# Patient Record
Sex: Female | Born: 1970 | Race: White | Hispanic: No | Marital: Married | Smoking: Never smoker
Health system: Southern US, Community
[De-identification: ages and names within clinical notes are randomized; demographics above are authoritative.]

## PROBLEM LIST (undated history)

## (undated) DIAGNOSIS — I10 Essential (primary) hypertension: Secondary | ICD-10-CM

## (undated) DIAGNOSIS — N189 Chronic kidney disease, unspecified: Secondary | ICD-10-CM

## (undated) DIAGNOSIS — R51 Headache: Secondary | ICD-10-CM

## (undated) DIAGNOSIS — L719 Rosacea, unspecified: Secondary | ICD-10-CM

## (undated) DIAGNOSIS — I7300 Raynaud's syndrome without gangrene: Secondary | ICD-10-CM

## (undated) DIAGNOSIS — C801 Malignant (primary) neoplasm, unspecified: Secondary | ICD-10-CM

## (undated) DIAGNOSIS — G43009 Migraine without aura, not intractable, without status migrainosus: Principal | ICD-10-CM

## (undated) HISTORY — DX: Essential (primary) hypertension: I10

## (undated) HISTORY — DX: Chronic kidney disease, unspecified: N18.9

## (undated) HISTORY — DX: Malignant (primary) neoplasm, unspecified: C80.1

## (undated) HISTORY — DX: Migraine without aura, not intractable, without status migrainosus: G43.009

## (undated) HISTORY — DX: Rosacea, unspecified: L71.9

## (undated) HISTORY — PX: SKIN SURGERY: SHX2413

## (undated) HISTORY — DX: Raynaud's syndrome without gangrene: I73.00

## (undated) HISTORY — DX: Headache: R51

---

## 1998-09-08 ENCOUNTER — Other Ambulatory Visit: Admission: RE | Admit: 1998-09-08 | Discharge: 1998-09-08 | Payer: Self-pay | Admitting: Obstetrics and Gynecology

## 1999-03-17 ENCOUNTER — Other Ambulatory Visit: Admission: RE | Admit: 1999-03-17 | Discharge: 1999-03-17 | Payer: Self-pay | Admitting: Obstetrics and Gynecology

## 1999-09-07 ENCOUNTER — Other Ambulatory Visit: Admission: RE | Admit: 1999-09-07 | Discharge: 1999-09-07 | Payer: Self-pay | Admitting: Internal Medicine

## 1999-10-25 ENCOUNTER — Other Ambulatory Visit: Admission: RE | Admit: 1999-10-25 | Discharge: 1999-10-25 | Payer: Self-pay | Admitting: *Deleted

## 2000-03-21 ENCOUNTER — Other Ambulatory Visit: Admission: RE | Admit: 2000-03-21 | Discharge: 2000-03-21 | Payer: Self-pay | Admitting: Obstetrics and Gynecology

## 2000-03-22 ENCOUNTER — Other Ambulatory Visit: Admission: RE | Admit: 2000-03-22 | Discharge: 2000-03-22 | Payer: Self-pay | Admitting: *Deleted

## 2000-03-22 ENCOUNTER — Encounter (INDEPENDENT_AMBULATORY_CARE_PROVIDER_SITE_OTHER): Payer: Self-pay

## 2000-04-26 ENCOUNTER — Ambulatory Visit (HOSPITAL_COMMUNITY): Admission: RE | Admit: 2000-04-26 | Discharge: 2000-04-26 | Payer: Self-pay | Admitting: Obstetrics and Gynecology

## 2000-04-28 ENCOUNTER — Encounter: Payer: Self-pay | Admitting: Obstetrics and Gynecology

## 2000-04-28 ENCOUNTER — Inpatient Hospital Stay (HOSPITAL_COMMUNITY): Admission: AD | Admit: 2000-04-28 | Discharge: 2000-04-28 | Payer: Self-pay | Admitting: Obstetrics and Gynecology

## 2000-05-02 ENCOUNTER — Emergency Department (HOSPITAL_COMMUNITY): Admission: EM | Admit: 2000-05-02 | Discharge: 2000-05-02 | Payer: Self-pay | Admitting: Emergency Medicine

## 2000-05-02 ENCOUNTER — Encounter: Payer: Self-pay | Admitting: Emergency Medicine

## 2000-08-02 ENCOUNTER — Other Ambulatory Visit: Admission: RE | Admit: 2000-08-02 | Discharge: 2000-08-02 | Payer: Self-pay | Admitting: Obstetrics and Gynecology

## 2001-01-30 ENCOUNTER — Other Ambulatory Visit: Admission: RE | Admit: 2001-01-30 | Discharge: 2001-01-30 | Payer: Self-pay | Admitting: Obstetrics and Gynecology

## 2001-05-15 ENCOUNTER — Inpatient Hospital Stay (HOSPITAL_COMMUNITY): Admission: AD | Admit: 2001-05-15 | Discharge: 2001-05-15 | Payer: Self-pay | Admitting: Obstetrics and Gynecology

## 2001-07-26 ENCOUNTER — Inpatient Hospital Stay (HOSPITAL_COMMUNITY): Admission: AD | Admit: 2001-07-26 | Discharge: 2001-07-29 | Payer: Self-pay | Admitting: Obstetrics and Gynecology

## 2001-08-02 ENCOUNTER — Encounter: Admission: RE | Admit: 2001-08-02 | Discharge: 2001-09-01 | Payer: Self-pay | Admitting: Obstetrics and Gynecology

## 2001-08-27 ENCOUNTER — Other Ambulatory Visit: Admission: RE | Admit: 2001-08-27 | Discharge: 2001-08-27 | Payer: Self-pay | Admitting: Obstetrics and Gynecology

## 2002-08-28 ENCOUNTER — Other Ambulatory Visit: Admission: RE | Admit: 2002-08-28 | Discharge: 2002-08-28 | Payer: Self-pay | Admitting: Obstetrics and Gynecology

## 2003-07-14 ENCOUNTER — Inpatient Hospital Stay (HOSPITAL_COMMUNITY): Admission: AD | Admit: 2003-07-14 | Discharge: 2003-07-17 | Payer: Self-pay | Admitting: Obstetrics and Gynecology

## 2003-08-24 ENCOUNTER — Other Ambulatory Visit: Admission: RE | Admit: 2003-08-24 | Discharge: 2003-08-24 | Payer: Self-pay | Admitting: Obstetrics and Gynecology

## 2005-11-21 ENCOUNTER — Encounter: Admission: RE | Admit: 2005-11-21 | Discharge: 2005-11-21 | Payer: Self-pay | Admitting: *Deleted

## 2006-02-28 ENCOUNTER — Ambulatory Visit (HOSPITAL_COMMUNITY): Admission: RE | Admit: 2006-02-28 | Discharge: 2006-02-28 | Payer: Self-pay | Admitting: Obstetrics and Gynecology

## 2008-12-03 ENCOUNTER — Encounter: Admission: RE | Admit: 2008-12-03 | Discharge: 2008-12-03 | Payer: Self-pay | Admitting: Obstetrics and Gynecology

## 2008-12-09 ENCOUNTER — Encounter: Admission: RE | Admit: 2008-12-09 | Discharge: 2008-12-09 | Payer: Self-pay | Admitting: Obstetrics and Gynecology

## 2009-06-10 ENCOUNTER — Encounter: Admission: RE | Admit: 2009-06-10 | Discharge: 2009-06-10 | Payer: Self-pay | Admitting: Obstetrics and Gynecology

## 2009-12-06 ENCOUNTER — Encounter: Admission: RE | Admit: 2009-12-06 | Discharge: 2009-12-06 | Payer: Self-pay | Admitting: Obstetrics and Gynecology

## 2009-12-09 ENCOUNTER — Encounter: Admission: RE | Admit: 2009-12-09 | Discharge: 2009-12-09 | Payer: Self-pay | Admitting: Obstetrics and Gynecology

## 2010-11-26 ENCOUNTER — Other Ambulatory Visit: Payer: Self-pay | Admitting: Obstetrics and Gynecology

## 2010-11-26 DIAGNOSIS — Z0000 Encounter for general adult medical examination without abnormal findings: Secondary | ICD-10-CM

## 2010-12-07 ENCOUNTER — Ambulatory Visit
Admission: RE | Admit: 2010-12-07 | Discharge: 2010-12-07 | Disposition: A | Discharge status: Home / Self Care | Payer: BC Managed Care – PPO | Source: Ambulatory Visit | Attending: Obstetrics and Gynecology | Admitting: Obstetrics and Gynecology

## 2010-12-07 DIAGNOSIS — Z0000 Encounter for general adult medical examination without abnormal findings: Secondary | ICD-10-CM

## 2011-03-24 NOTE — H&P (Signed)
[facility]  PatientRessie Richard, [place]                           MRN: [id number] Attending:  Lenoard Aden, M.D. CC:         [facility]                         History and Physical  CHIEF COMPLAINT:                  Worsening pelvic pain.  HISTORY OF PRESENT ILLNESS:       The patient is a 40 year old white female, G0, P0, who presents with chronic right-sided pain.  She has had no previous history of endometriosis.  Pregnancy history is noncontributory.  GYNECOLOGIC HISTORY:              Noncontributory.  PAST MEDICAL HISTORY:             Remarkable for abnormal Pap smear.  Wisdom teeth removal.  Chronic hypertension.  Raynauds syndrome.  MEDICATIONS:                      Cardizem, Ovral, and a multivitamin.  ALLERGIES:                        NEOSPORIN, FLAGYL, and SULFA.  SOCIAL HISTORY:                   The patient is a nonsmoker and nondrinker, and she denies domestic or physical violence.  FAMILY HISTORY:                   Remarkable for diabetes and cardiovascular disease.  PHYSICAL EXAMINATION:  GENERAL:                          She is a well-developed, well-nourished white female in no apparent distress.  HEENT:                            Normal.  LUNGS:                            Clear.  HEART:                            Regular rhythm.  ABDOMEN:                          Soft, scaphoid, nontender.  LABORATORY DATA:                  Ultrasound revealing a normal size uterus with no adnexal masses.  IMPRESSION:                       Worsening right lower quadrant pain with increasing dysmenorrhea and dyspareunia.  PLAN:                             Diagnostic laparoscopy.  Risks of anesthesia, infection, bleeding, injury to abdominal organs, need for repair discussed.  Inability to cure pain noted.  The possibility of delayed versus immediate complications to  include bowel and bladder injury noted.  The patient  acknowledges and desires to proceed.DD:  04/26/00 TD:  04/26/00 Job: [id number]

## 2011-03-24 NOTE — Op Note (Signed)
NAMETondra Richard, [name]                  ACCOUNT NO.:  [id number]   MEDICAL RECORD NO.:  [id number]          PATIENT TYPE:  AMB   LOCATION:  SDC                           FACILITY:  [facility]   PHYSICIAN:  Lenoard Aden, M.D.DATE OF BIRTH:  [birth year]   DATE OF PROCEDURE:  02/28/2006  DATE OF DISCHARGE:                                 OPERATIVE REPORT   PREOPERATIVE DIAGNOSIS:  Right lower quadrant pain.   POSTOPERATIVE DIAGNOSIS:  Pelvic adhesions.   OPERATION PERFORMED:  Diagnostic laparoscopy, lysis of adhesions.   SURGEON:  Lenoard Aden, M.D.   ANESTHESIA:  General.   ESTIMATED BLOOD LOSS:  Less than 50 mL.   COMPLICATIONS:  None.   DRAINS:  Foley.   COUNTS:  Correct.   Patient to recovery in good condition.   DESCRIPTION OF PROCEDURE:  After being apprised of the risks of anesthesia,  infection, bleeding, intra-abdominal injuries with need for repair, delayed  versus immediate complications to include bowel or bladder injury, patient  brought to the operating room where she was administered general anesthetic  without complications, prepped and draped in the usual sterile fashion.  Foley catheter placed.  After achieving adequate anesthesia, single toothed  tenaculum and Rubens cannula were placed per vagina.  Infraumbilical  incision made after placement of a local anesthetic.  Veress needle placed.  Opening pressure -2.  4 L CO2 insufflated without difficulty.  Trocar  placed.  Visualization revealed atraumatic trocar entry, normal liver,  gallbladder bed. There are anterolateral adhesions of the right bowel  mesentery to the right pelvic and abdominal side wall and normal uterus,  normal cul-de-sac anteriorly and posteriorly, normal tubes and ovaries, no  evidence of endometriosis.  A 5 mm port was made for visualization.  Normal  appendix was visualized.  The adhesions are then grasped with an atraumatic  grasper and tended away from the anterior abdominal wall with  the bowel well  out of the operative field. These adhesions were then lysed using monopolar  scissors in an avascular fashion all the way to the level of their insertion  into the side wall and good fraying of the adhesions is noted.  Minimal  bleeding is noted.  Good hemostasis was achieved.  At this point  revealing no further pathology at this time, CO2 was released, all  instruments were removed under direct visualization.  Incisions were then  closed with 0 Vicryl and Dermabond.  Instruments removed from the vagina.  Patient was awakened, and transferred to recovery in good condition.      Lenoard Aden, M.D.  Electronically Signed     RJT/MEDQ  D:  02/28/2006  T:  03/01/2006  Job:  [id number]

## 2011-03-24 NOTE — H&P (Signed)
[facility] [place]  PatientAdana, Marik [facility] H Visit Number: [id number] MRN: [id number]          Service Type: OBS Location: 910B [id number] [id number] Attending Physician:  Lenoard Aden Dictated by:   Lenoard Aden, M.D. Admit Date:  07/26/2001   CC:         [facility]   History and Physical  CHIEF COMPLAINT:              Chronic hypertension at 38 weeks, for induction.  HISTORY OF PRESENT ILLNESS:   The patient is a 40 year old white female, G1, P0, EDD August 08, 2001, at 38+ weeks, for induction.  The patient has a history of laparoscopy with lysis of adhesions in June 2001, history of chronic hypertension.  MEDICATIONS:                  Aldomet.  ALLERGIES:                    NEO-SYNEPHRINE, FLAGYL, and IBUPROFEN.  PRENATAL COURSE:              Uncomplicated.  Blood pressure well controlled on Aldomet.  Fetal heart rate surveillance within normal limits.  PHYSICAL EXAMINATION:  GENERAL:                      Well-developed, well-nourished white female in no apparent distress.  HEENT:                        Normal.  LUNGS:                        Clear.  HEART:                        Regular rhythm.  ABDOMEN:                      Soft, gravid, nontender.  Estimated fetal weight 7 to 7-1/2 pounds.  PELVIC:                       Cervix is 2 cm, thick, vertex, -2.  EXTREMITIES:                  No cords.  NEUROLOGIC:                   Nonfocal.  IMPRESSION:                   Chronic hypertension, at term.  PLAN:                         Proceed with induction.  Anticipate attempts at vaginal delivery. Dictated by:   Lenoard Aden, M.D. Attending Physician:  Lenoard Aden DD:  07/26/01 TD:  07/26/01 Job: [id number]

## 2011-03-24 NOTE — Op Note (Signed)
[facility]  Patient:    Mary Richard, Mary Richard                         MRN: [id number] Proc. Date: 04/26/00 Adm. Date:  [id number] Disc. Date: [id number] Attending:  Lenoard Aden CC:         [facility]                           Operative Report  PREOPERATIVE DIAGNOSIS:       Chronic pelvic pain.  POSTOPERATIVE DIAGNOSIS:      Pelvic adhesions.  PROCEDURE:                    Diagnostic laparoscopy with lysis of adhesions.  SURGEON:                      Lenoard Aden, M.D.  ANESTHESIA:                   General.  ESTIMATED BLOOD LOSS:         Less than 50 cc.  COMPLICATIONS:                None.  DRAINS:                       None.  COUNTS:                       Correct.  DISPOSITION:                  To the recovery room in good condition.  BRIEF OPERATIVE NOTE:         After being apprised of the risks of anesthesia, infection, bleeding, injury to abdominal organs with the need for repair and delayed versus immediate complications to include bowel or bladder injury, possible inability to cure pain, the patient was brought to the operating room, where she was prepped and draped in the usual sterile fashion.  The bladder was catheterized.  The feet were in the [place] stirrups.  Examination under anesthesia revealed a small anteflexed uterus and no adnexal masses.  A Hulka tenaculum was placed per vagina.  An infraumbilical incision was made with a scalpel.  A Veress needle was placed.  Opening pressure of -2 was noted.  Then, 3 L of CO2 was insufflated without difficulty.  A 10 mm trocar was placed.  Visualization revealed atraumatic trocar entry, normal liver and gallbladder bed, adhesions of omentum to the right anterolateral pelvic sidewall and some adhesion of the sigmoid mesentery to the left pelvic sidewall.  A 5 mm trocar site was made atraumatically subprapubically.  A grasper was entered to help dissect all of these adhesions  using sharp monopolar cautery.  At no time was the cautery used close to the bowel and no interruption of bowel integrity of serosa was noted.  Normal uterus, normal tubes and ovaries were identified.  Otherwise, normal anterior and posterior cul-de-sac.  After lysis of adhesions was achieved and complete hemostasis was achieved, all instruments were removed under direct visualization.  CO2 was released. The incisions were closed using 0 and 4-0 Vicryl.  Dilute Marcaine solution was placed.  The instruments were removed from the vagina.  The patient tolerated the procedure well and went to the recovery room in good condition. DD:  04/26/00  TD:  04/29/00 Job: [id number]

## 2011-03-24 NOTE — H&P (Signed)
   NAME:  Mary Richard, Mary Richard                            ACCOUNT NO.:  [id number]   MEDICAL RECORD NO.:  [id number]                   PATIENT TYPE:  INP   LOCATION:  9174                                 FACILITY:  [facility]   PHYSICIAN:  Lenoard Aden, M.D.             DATE OF BIRTH:  04/23/1971   DATE OF ADMISSION:  07/14/2003  DATE OF DISCHARGE:                                HISTORY & PHYSICAL   CHIEF COMPLAINT:  Chronic hypertension, for induction.   HISTORY OF PRESENT ILLNESS:  The patient is a 40 year old white female, G 2,  P 1, with an EDD of July 25, 2003, at 38+ weeks, who presents for  induction, with chronic hypertension, at term.   ALLERGIES:  FLAGYL AND IBUPROFEN AND NEOSPORIN.   CURRENT MEDICATIONS:  1. Prenatal vitamins.  2. Aldomet.   PREVIOUS OBSTETRIC HISTORY:  Uncomplicated vaginal delivery of a 6 pound 13  ounce female in 2002.   PAST SURGICAL HISTORY:  The patient has a history of a laparoscopy for  adhesions in 2001.   PAST MEDICAL HISTORY:  History of hypertension, and currently on Aldomet as  noted.   FAMILY HISTORY:  Myocardial infarction, heart disease, migraines.   PRENATAL LABORATORY DATA:  Blood type O-positive.  Rh antibody negative.  Rubella immune.  VDRL nonreactive.  Hepatitis and HIV negative.  GBS  positive.   PRENATAL COURSE:  Otherwise uncomplicated.   PHYSICAL EXAMINATION:  GENERAL:  She is a well-nourished, well-developed  white female, in no acute distress.  HEENT:  Normal.  LUNGS:  Clear.  HEART:  A regular rhythm.  ABDOMEN:  Soft, gravid, nontender.  Estimated fetal weight is about 7-1/2  pounds.  PELVIC:  Cervix is 2 to 3 cm, 80%, vertex, and -2.  EXTREMITIES:  No cords.  NEUROLOGIC:  Nonfocal.    IMPRESSION:  Chronic hypertension, at term, for induction.   PLAN:  To proceed with Pitocin induction.  This has been discussed.                                                   Lenoard Aden, M.D.    RJT/MEDQ  D:   07/14/2003  T:  07/14/2003  Job:  [id number]

## 2011-11-09 ENCOUNTER — Other Ambulatory Visit: Payer: Self-pay | Admitting: Obstetrics and Gynecology

## 2011-11-09 DIAGNOSIS — Z1231 Encounter for screening mammogram for malignant neoplasm of breast: Secondary | ICD-10-CM

## 2011-12-12 ENCOUNTER — Ambulatory Visit
Admission: RE | Admit: 2011-12-12 | Discharge: 2011-12-12 | Disposition: A | Discharge status: Home / Self Care | Payer: BC Managed Care – PPO | Source: Ambulatory Visit | Attending: Obstetrics and Gynecology | Admitting: Obstetrics and Gynecology

## 2011-12-12 DIAGNOSIS — Z1231 Encounter for screening mammogram for malignant neoplasm of breast: Secondary | ICD-10-CM

## 2012-11-28 ENCOUNTER — Other Ambulatory Visit: Payer: Self-pay | Admitting: Obstetrics and Gynecology

## 2012-11-28 DIAGNOSIS — Z1231 Encounter for screening mammogram for malignant neoplasm of breast: Secondary | ICD-10-CM

## 2012-12-17 ENCOUNTER — Ambulatory Visit: Payer: Self-pay

## 2012-12-27 ENCOUNTER — Ambulatory Visit
Admission: RE | Admit: 2012-12-27 | Discharge: 2012-12-27 | Disposition: A | Discharge status: Home / Self Care | Payer: BC Managed Care – PPO | Source: Ambulatory Visit | Attending: Obstetrics and Gynecology | Admitting: Obstetrics and Gynecology

## 2012-12-27 DIAGNOSIS — Z1231 Encounter for screening mammogram for malignant neoplasm of breast: Secondary | ICD-10-CM

## 2013-01-06 ENCOUNTER — Ambulatory Visit: Payer: BC Managed Care – PPO | Attending: Internal Medicine

## 2013-01-06 DIAGNOSIS — IMO0001 Reserved for inherently not codable concepts without codable children: Secondary | ICD-10-CM | POA: Insufficient documentation

## 2013-01-06 DIAGNOSIS — M545 Low back pain, unspecified: Secondary | ICD-10-CM | POA: Insufficient documentation

## 2013-01-06 DIAGNOSIS — M25559 Pain in unspecified hip: Secondary | ICD-10-CM | POA: Insufficient documentation

## 2013-01-14 ENCOUNTER — Ambulatory Visit: Payer: BC Managed Care – PPO | Admitting: Rehabilitation

## 2013-01-16 ENCOUNTER — Ambulatory Visit: Payer: BC Managed Care – PPO | Admitting: Physical Therapy

## 2013-01-16 ENCOUNTER — Encounter: Payer: Self-pay | Admitting: Physical Therapy

## 2013-01-20 ENCOUNTER — Ambulatory Visit: Payer: BC Managed Care – PPO

## 2013-01-23 ENCOUNTER — Ambulatory Visit: Payer: BC Managed Care – PPO | Admitting: Physical Therapy

## 2013-01-28 ENCOUNTER — Ambulatory Visit: Payer: BC Managed Care – PPO | Admitting: Physical Therapy

## 2013-01-30 ENCOUNTER — Ambulatory Visit: Payer: BC Managed Care – PPO | Admitting: Physical Therapy

## 2013-02-03 ENCOUNTER — Ambulatory Visit: Payer: BC Managed Care – PPO | Admitting: Physical Therapy

## 2013-02-06 ENCOUNTER — Ambulatory Visit: Payer: BC Managed Care – PPO | Admitting: Physical Therapy

## 2013-02-10 ENCOUNTER — Ambulatory Visit: Payer: BC Managed Care – PPO | Attending: Internal Medicine | Admitting: Physical Therapy

## 2013-02-10 DIAGNOSIS — M545 Low back pain, unspecified: Secondary | ICD-10-CM | POA: Insufficient documentation

## 2013-02-10 DIAGNOSIS — M25559 Pain in unspecified hip: Secondary | ICD-10-CM | POA: Insufficient documentation

## 2013-02-10 DIAGNOSIS — IMO0001 Reserved for inherently not codable concepts without codable children: Secondary | ICD-10-CM | POA: Insufficient documentation

## 2013-02-13 ENCOUNTER — Ambulatory Visit: Payer: BC Managed Care – PPO | Admitting: Physical Therapy

## 2013-02-17 ENCOUNTER — Ambulatory Visit: Payer: BC Managed Care – PPO | Admitting: Physical Therapy

## 2013-02-24 ENCOUNTER — Ambulatory Visit: Payer: BC Managed Care – PPO | Admitting: Physical Therapy

## 2013-02-26 ENCOUNTER — Ambulatory Visit: Payer: BC Managed Care – PPO | Admitting: Physical Therapy

## 2013-07-17 ENCOUNTER — Ambulatory Visit (INDEPENDENT_AMBULATORY_CARE_PROVIDER_SITE_OTHER): Payer: BC Managed Care – PPO | Admitting: Neurology

## 2013-07-17 ENCOUNTER — Encounter: Payer: Self-pay | Admitting: Neurology

## 2013-07-17 VITALS — BP 124/68 | HR 89 | Ht 63.4 in | Wt 156.0 lb

## 2013-07-17 DIAGNOSIS — G43009 Migraine without aura, not intractable, without status migrainosus: Secondary | ICD-10-CM

## 2013-07-17 DIAGNOSIS — I10 Essential (primary) hypertension: Secondary | ICD-10-CM

## 2013-07-17 DIAGNOSIS — I7300 Raynaud's syndrome without gangrene: Secondary | ICD-10-CM

## 2013-07-17 HISTORY — DX: Migraine without aura, not intractable, without status migrainosus: G43.009

## 2013-07-17 MED ORDER — BUTALBITAL-APAP-CAFFEINE 50-325-40 MG PO TABS: 1.0000 | ORAL_TABLET | Freq: Four times a day (QID) | ORAL | Status: DC | PRN

## 2013-07-17 MED ORDER — TOPIRAMATE 200 MG PO TABS: 200.0000 mg | ORAL_TABLET | Freq: Every day | ORAL | Status: DC

## 2013-07-17 MED ORDER — CYCLOBENZAPRINE HCL 10 MG PO TABS: 10.0000 mg | ORAL_TABLET | Freq: Every day | ORAL | Status: DC | PRN
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
## 2013-07-17 NOTE — Patient Instructions (Addendum)
I think overall you are doing fairly well but I do want to suggest a few things today:  Remember to drink plenty of fluid, eat healthy meals and do not skip any meals. Try to eat protein with a every meal and eat a healthy snack such as fruit or nuts in between meals. Try to keep a regular sleep-wake schedule and try to exercise daily, particularly in the form of walking, 20-30 minutes a day, if you can.   Engage in social activities in your community and with your family and try to keep up with current events by reading the newspaper or watching the news.   As far as your medications are concerned, I would like to suggest: continue topamax, and as needed flexeril, and we can try as needed fioricet.  As far as diagnostic testing: no new test at this time.   I would like to see you back in 3 months, sooner if we need to. Please call us with any interim questions, concerns, problems, updates or refill requests.  Please also call us for any test results so we can go over those with you on the phone. Brett Canales is my clinical assistant and will answer any of your questions and relay your messages to me and also relay most of my messages to you.  Our phone number is [phone]. We also have an after hours call service for urgent matters and there is a physician on-call for urgent questions. For any emergencies you know to call 911 or go to the nearest emergency room.   Please remember, common headache triggers are: sleep deprivation, dehydration, overheating, stress, hypoglycemia or skipping meals and blood sugar fluctuations, excessive pain medications or excessive alcohol use or caffeine withdrawal. Some people have food triggers such as aged cheese, orange juice or chocolate, especially dark chocolate, or MSG (monosodium glutamate). Try to avoid these headache triggers as much possible. It may be helpful to keep a headache diary to figure out what makes your headaches worse or brings them on and what  alleviates them. Some people report headache onset after exercise but studies have shown that regular exercise may actually prevent headaches from coming. If you have exercise-induced headaches, please make sure that you drink plenty of fluid before and after exercising and that you do not over do it and do not overheat.

## 2013-09-11 ENCOUNTER — Other Ambulatory Visit: Payer: Self-pay

## 2013-11-20 ENCOUNTER — Other Ambulatory Visit: Payer: Self-pay

## 2013-11-20 DIAGNOSIS — Z1231 Encounter for screening mammogram for malignant neoplasm of breast: Secondary | ICD-10-CM

## 2013-12-29 ENCOUNTER — Other Ambulatory Visit: Payer: Self-pay

## 2013-12-29 ENCOUNTER — Ambulatory Visit
Admission: RE | Admit: 2013-12-29 | Discharge: 2013-12-29 | Disposition: A | Discharge status: Home / Self Care | Payer: BC Managed Care – PPO | Source: Ambulatory Visit

## 2013-12-29 ENCOUNTER — Ambulatory Visit: Payer: Self-pay

## 2013-12-29 DIAGNOSIS — Z1231 Encounter for screening mammogram for malignant neoplasm of breast: Secondary | ICD-10-CM

## 2013-12-30 ENCOUNTER — Other Ambulatory Visit: Payer: Self-pay | Admitting: *Deleted

## 2013-12-30 ENCOUNTER — Other Ambulatory Visit: Payer: Self-pay | Admitting: Obstetrics and Gynecology

## 2013-12-30 DIAGNOSIS — R928 Other abnormal and inconclusive findings on diagnostic imaging of breast: Secondary | ICD-10-CM

## 2014-01-14 ENCOUNTER — Ambulatory Visit
Admission: RE | Admit: 2014-01-14 | Discharge: 2014-01-14 | Disposition: A | Discharge status: Home / Self Care | Payer: Self-pay | Source: Ambulatory Visit | Attending: Obstetrics and Gynecology | Admitting: Obstetrics and Gynecology

## 2014-01-14 DIAGNOSIS — R928 Other abnormal and inconclusive findings on diagnostic imaging of breast: Secondary | ICD-10-CM

## 2014-01-15 ENCOUNTER — Encounter: Payer: Self-pay | Admitting: Neurology

## 2014-01-15 ENCOUNTER — Encounter (INDEPENDENT_AMBULATORY_CARE_PROVIDER_SITE_OTHER): Payer: Self-pay

## 2014-01-15 ENCOUNTER — Ambulatory Visit (INDEPENDENT_AMBULATORY_CARE_PROVIDER_SITE_OTHER): Payer: BC Managed Care – PPO | Admitting: Neurology

## 2014-01-15 VITALS — BP 106/62 | HR 68 | Temp 97.3°F | Ht 63.4 in | Wt 160.0 lb

## 2014-01-15 DIAGNOSIS — G43009 Migraine without aura, not intractable, without status migrainosus: Secondary | ICD-10-CM

## 2014-01-15 MED ORDER — BUTALBITAL-APAP-CAFFEINE 50-325-40 MG PO TABS: 1.0000 | ORAL_TABLET | Freq: Four times a day (QID) | ORAL | Status: DC | PRN

## 2014-01-15 MED ORDER — TOPIRAMATE 200 MG PO TABS: 200.0000 mg | ORAL_TABLET | Freq: Every day | ORAL | Status: DC

## 2014-01-15 NOTE — Patient Instructions (Addendum)
Please remember, common headache triggers are: sleep deprivation, dehydration, overheating, stress, hypoglycemia or skipping meals and blood sugar fluctuations, excessive pain medications or excessive alcohol use or caffeine withdrawal. Some people have food triggers such as aged cheese, orange juice or chocolate, especially dark chocolate, or MSG (monosodium glutamate). Try to avoid these headache triggers as much possible. It may be helpful to keep a headache diary to figure out what makes your headaches worse or brings them on and what alleviates them. Some people report headache onset after exercise but studies have shown that regular exercise may actually prevent headaches from coming. If you have exercise-induced headaches, please make sure that you drink plenty of fluid before and after exercising and that you do not over do it and do not overheat.  No changes in your medications, continue as needed Flexeril, as needed fioricet and topamax. Prescriptions are refilled.
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
## 2014-07-20 ENCOUNTER — Ambulatory Visit (INDEPENDENT_AMBULATORY_CARE_PROVIDER_SITE_OTHER): Payer: BC Managed Care – PPO | Admitting: Neurology

## 2014-07-20 ENCOUNTER — Encounter (INDEPENDENT_AMBULATORY_CARE_PROVIDER_SITE_OTHER): Payer: Self-pay

## 2014-07-20 ENCOUNTER — Encounter: Payer: Self-pay | Admitting: Neurology

## 2014-07-20 VITALS — BP 106/68 | HR 69 | Ht 63.4 in | Wt 160.0 lb

## 2014-07-20 DIAGNOSIS — G43009 Migraine without aura, not intractable, without status migrainosus: Secondary | ICD-10-CM

## 2014-07-20 MED ORDER — TOPIRAMATE 200 MG PO TABS: 200.0000 mg | ORAL_TABLET | Freq: Every day | ORAL | Status: DC

## 2014-07-20 MED ORDER — BUTALBITAL-APAP-CAFFEINE 50-325-40 MG PO TABS: 1.0000 | ORAL_TABLET | Freq: Four times a day (QID) | ORAL | Status: DC | PRN
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
## 2014-07-20 NOTE — Patient Instructions (Signed)
Please remember, common headache triggers are: sleep deprivation, dehydration, overheating, stress, hypoglycemia or skipping meals and blood sugar fluctuations, excessive pain medications or excessive alcohol use or caffeine withdrawal. Some people have food triggers such as aged cheese, orange juice or chocolate, especially dark chocolate, or MSG (monosodium glutamate). Try to avoid these headache triggers as much possible. It may be helpful to keep a headache diary to figure out what makes your headaches worse or brings them on and what alleviates them. Some people report headache onset after exercise but studies have shown that regular exercise may actually prevent headaches from coming. If you have exercise-induced headaches, please make sure that you drink plenty of fluid before and after exercising and that you do not over do it and do not overheat.   We will continue with the current medications.   Stay well hydrated.   I would like to see you back in 6 months.

## 2015-01-18 ENCOUNTER — Encounter: Payer: Self-pay | Admitting: Neurology

## 2015-01-18 ENCOUNTER — Ambulatory Visit (INDEPENDENT_AMBULATORY_CARE_PROVIDER_SITE_OTHER): Payer: BC Managed Care – PPO | Admitting: Neurology

## 2015-01-18 VITALS — BP 110/65 | HR 65 | Temp 98.4°F | Resp 16 | Ht 63.0 in | Wt 162.0 lb

## 2015-01-18 DIAGNOSIS — G43009 Migraine without aura, not intractable, without status migrainosus: Secondary | ICD-10-CM | POA: Diagnosis not present

## 2015-01-18 DIAGNOSIS — I10 Essential (primary) hypertension: Secondary | ICD-10-CM | POA: Diagnosis not present

## 2015-01-18 DIAGNOSIS — R635 Abnormal weight gain: Secondary | ICD-10-CM

## 2015-01-18 MED ORDER — TOPIRAMATE 200 MG PO TABS: 200.0000 mg | ORAL_TABLET | Freq: Every day | ORAL | Status: DC

## 2015-01-18 MED ORDER — BUTALBITAL-APAP-CAFFEINE 50-325-40 MG PO TABS: 1.0000 | ORAL_TABLET | Freq: Four times a day (QID) | ORAL | Status: DC | PRN

## 2015-01-18 NOTE — Patient Instructions (Addendum)
Please remember, common headache triggers are: sleep deprivation, dehydration, overheating, stress, hypoglycemia or skipping meals and blood sugar fluctuations, excessive pain medications or excessive alcohol use or caffeine withdrawal. Some people have food triggers such as aged cheese, orange juice or chocolate, especially dark chocolate, or MSG (monosodium glutamate). Try to avoid these headache triggers as much possible. It may be helpful to keep a headache diary to figure out what makes your headaches worse or brings them on and what alleviates them. Some people report headache onset after exercise but studies have shown that regular exercise may actually prevent headaches from coming. If you have exercise-induced headaches, please make sure that you drink plenty of fluid before and after exercising and that you do not over do it and do not overheat.  We will keep your meds the
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
## 2015-07-20 ENCOUNTER — Ambulatory Visit (INDEPENDENT_AMBULATORY_CARE_PROVIDER_SITE_OTHER): Payer: BC Managed Care – PPO | Admitting: Neurology

## 2015-07-20 ENCOUNTER — Encounter: Payer: Self-pay | Admitting: Neurology

## 2015-07-20 VITALS — BP 114/68 | HR 70 | Resp 16 | Ht 63.0 in | Wt 168.0 lb

## 2015-07-20 DIAGNOSIS — G43009 Migraine without aura, not intractable, without status migrainosus: Secondary | ICD-10-CM

## 2015-07-20 MED ORDER — BUTALBITAL-APAP-CAFFEINE 50-325-40 MG PO TABS
1.0000 | ORAL_TABLET | Freq: Four times a day (QID) | ORAL | Status: DC | PRN
Start: 2015-07-20 — End: 2016-01-17

## 2015-07-20 MED ORDER — TOPIRAMATE 200 MG PO TABS: 200.0000 mg | ORAL_TABLET | Freq: Every day | ORAL | Status: DC

## 2015-07-20 MED ORDER — TOPIRAMATE 25 MG PO TABS: 50.0000 mg | ORAL_TABLET | Freq: Every day | ORAL | Status: DC
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
## 2015-07-20 NOTE — Patient Instructions (Addendum)
We will add a smaller dose of topamax 25 mg in the mornings in addition to your 200 mg each night. You may go up to 50 mg in the morning if you tolerate it, after a couple of weeks.   Please remember, common headache triggers are: sleep deprivation, dehydration, overheating, stress, hypoglycemia or skipping meals and blood sugar fluctuations, excessive pain medications or excessive alcohol use or caffeine withdrawal. Some people have food triggers such as aged cheese, orange juice or chocolate, especially dark chocolate, or MSG (monosodium glutamate). Try to avoid these headache triggers as much possible. It may be helpful to keep a headache diary to figure out what makes your headaches worse or brings them on and what alleviates them. Some people report headache onset after exercise but studies have shown that regular exercise may actually prevent headaches from coming. If you have exercise-induced headaches, please make sure that you drink plenty of fluid before and after exercising and that you do not over do it and do not overheat.

## 2016-01-17 ENCOUNTER — Ambulatory Visit (INDEPENDENT_AMBULATORY_CARE_PROVIDER_SITE_OTHER): Payer: BC Managed Care – PPO | Admitting: Neurology

## 2016-01-17 ENCOUNTER — Encounter: Payer: Self-pay | Admitting: Neurology

## 2016-01-17 VITALS — BP 132/70 | HR 70 | Resp 16 | Ht 63.0 in | Wt 166.0 lb

## 2016-01-17 DIAGNOSIS — G43009 Migraine without aura, not intractable, without status migrainosus: Secondary | ICD-10-CM | POA: Diagnosis not present

## 2016-01-17 MED ORDER — TOPIRAMATE 200 MG PO TABS: 200.0000 mg | ORAL_TABLET | Freq: Every day | ORAL | Status: DC

## 2016-01-17 MED ORDER — BUTALBITAL-APAP-CAFFEINE 50-325-40 MG PO TABS: 1.0000 | ORAL_TABLET | Freq: Four times a day (QID) | ORAL | Status: DC | PRN

## 2016-01-17 MED ORDER — TOPIRAMATE 25 MG PO TABS: 50.0000 mg | ORAL_TABLET | Freq: Every day | ORAL | Status: DC
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
## 2016-01-17 NOTE — Patient Instructions (Addendum)
Please remember, common headache triggers are: sleep deprivation, dehydration, overheating, stress, hypoglycemia or skipping meals and blood sugar fluctuations, excessive pain medications or excessive alcohol use or caffeine withdrawal. Some people have food triggers such as aged cheese, orange juice or chocolate, especially dark chocolate, or MSG (monosodium glutamate). Try to avoid these headache triggers as much possible. It may be helpful to keep a headache diary to figure out what makes your headaches worse or brings them on and what alleviates them. Some people report headache onset after exercise but studies have shown that regular exercise may actually prevent headaches from coming. If you have exercise-induced headaches, please make sure that you drink plenty of fluid before and after exercising and that you do not over do it and do not overheat.   We will continue your topamax 50 mg in the morning and 200 mg each night. You can use fioricet for as needed use, however, but please be aware that this is to be used cautiously as it can be addicting and sedating. It is not for daily use.

## 2016-08-31 ENCOUNTER — Other Ambulatory Visit: Payer: Self-pay | Admitting: Neurology

## 2016-08-31 DIAGNOSIS — G43009 Migraine without aura, not intractable, without status migrainosus: Secondary | ICD-10-CM

## 2016-08-31 NOTE — Telephone Encounter (Signed)
RX faxed to [facility]. Received a receipt of confirmation.

## 2017-01-03 ENCOUNTER — Other Ambulatory Visit: Payer: Self-pay | Admitting: Neurology

## 2017-01-03 DIAGNOSIS — G43009 Migraine without aura, not intractable, without status migrainosus: Secondary | ICD-10-CM

## 2017-01-16 ENCOUNTER — Encounter: Payer: Self-pay | Admitting: Neurology

## 2017-01-16 ENCOUNTER — Ambulatory Visit (INDEPENDENT_AMBULATORY_CARE_PROVIDER_SITE_OTHER): Payer: BC Managed Care – PPO | Admitting: Neurology

## 2017-01-16 DIAGNOSIS — G43009 Migraine without aura, not intractable, without status migrainosus: Secondary | ICD-10-CM

## 2017-01-16 MED ORDER — BUTALBITAL-APAP-CAFFEINE 50-325-40 MG PO TABS: ORAL_TABLET | ORAL | 3 refills | Status: DC

## 2017-01-16 MED ORDER — TOPIRAMATE 50 MG PO TABS: 50.0000 mg | ORAL_TABLET | Freq: Every day | ORAL | 3 refills | Status: DC

## 2017-01-16 MED ORDER — TOPIRAMATE 200 MG PO TABS: 200.0000 mg | ORAL_TABLET | Freq: Every day | ORAL | 3 refills | Status: DC

## 2017-01-16 NOTE — Patient Instructions (Signed)
We will keep your meds the same. You look well.  We will see you in a year.
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
## 2017-05-23 ENCOUNTER — Other Ambulatory Visit: Payer: Self-pay | Admitting: Neurology

## 2017-05-23 DIAGNOSIS — G43009 Migraine without aura, not intractable, without status migrainosus: Secondary | ICD-10-CM

## 2017-05-23 NOTE — Telephone Encounter (Signed)
Pt is up to date on her medications and is due for a refill on her fioricet. Will print and give to Dr. Rexene Alberts for review and signature.  Dr. Rexene Alberts signed the RX. RX sent to [facility]. Received a receipt of confirmation.

## 2017-08-29 ENCOUNTER — Telehealth: Payer: Self-pay | Admitting: Neurology

## 2017-08-29 NOTE — Telephone Encounter (Signed)
I called Mary Richard, offered her an appt with Jinny Blossom, NP on 09/11/17 at 1:30pm. Mary Richard is agreeable to this and verbalized understanding of new appt date and time.

## 2017-08-29 NOTE — Telephone Encounter (Signed)
Patient says migraines have increased over the past 2-3 months. She has an appointment with Dr. Rexene Alberts on 01-16-18 but needs to be seen soon for the migraines.

## 2017-09-11 ENCOUNTER — Ambulatory Visit: Payer: Self-pay | Admitting: Adult Health

## 2017-09-13 ENCOUNTER — Ambulatory Visit: Payer: BC Managed Care – PPO | Admitting: Adult Health

## 2017-09-13 ENCOUNTER — Encounter: Payer: Self-pay | Admitting: Adult Health

## 2017-09-13 DIAGNOSIS — G43009 Migraine without aura, not intractable, without status migrainosus: Secondary | ICD-10-CM | POA: Diagnosis not present

## 2017-09-13 MED ORDER — BUTALBITAL-APAP-CAFFEINE 50-325-40 MG PO TABS: ORAL_TABLET | ORAL | 3 refills | Status: DC

## 2017-09-13 MED ORDER — SUMATRIPTAN SUCCINATE 50 MG PO TABS: ORAL_TABLET | ORAL | 5 refills | Status: DC

## 2017-09-13 NOTE — Progress Notes (Addendum)
PATIENT: Mary Richard DOB: 05/27/1971  REASON FOR VISIT: follow up HISTORY FROM: patient  HISTORY OF PRESENT ILLNESS: Today 09/13/17 Mary Richard is a 46 year old female with a history of migraine headaches.  She states that her headaches have been under relatively good control until the last couple of months.  She states that her stress at home has increased.  She states that her mother-in-law had a heart attack and they have been having to help care for her and take her to appointments.  She states that the headaches occur on the top of the head.  She states that she is having photophobia as well as nausea.  She states that when she does get a headache she doestake Fioricet but now the headaches are lasting up to 3 days.  She states that she also gets headaches around her menstrual cycle.  She reports on average she can have anywhere from 1-3 headaches a week.  In the past she has tried triptans and muscle relaxers as acute therapy but did not find them beneficial.  She states that she was also placed on an antidepressant initially to control her headaches but that was also not beneficial.  She returns today for an evaluation.  HISTORY , 01/16/2017:  She reports doing well, HAs under control, tolerating Topamax 50 mg in the morning and 200 at night, using Fioricet as needed, needs refills on all her meds, sometimes forgets the Topamax in the morning. Overall, has been doing well, no recent illness, no side effects from medication.  The patient's allergies, current medications, family history, past medical history, past social history, past surgical history and problem list were reviewed and updated as appropriate.   REVIEW OF SYSTEMS: Out of a complete 14 system review of symptoms, the patient complains only of the following symptoms, and all other reviewed systems are negative.  Headache, nausea, light sensitivity  ALLERGIES: Allergies  Allergen Reactions  . Flagyl [Metronidazole]   .  Ibuprofen   . Neosporin [Neomycin-Bacitracin Zn-Polymyx]     HOME MEDICATIONS: Outpatient Medications Prior to Visit  Medication Sig Dispense Refill  . butalbital-acetaminophen-caffeine (FIORICET, ESGIC) 50-325-40 MG tablet take 1 to 2 tablets by mouth every 6 hours if needed for headache 20 tablet 3  . Cholecalciferol (VITAMIN D) 2000 UNITS CAPS Take 2,000 capsules by mouth daily.    Marland Kitchen diltiazem (CARDIZEM SR) 90 MG 12 hr capsule     . Fish Oil OIL Take 3,000 mg by mouth daily.    . fluticasone (FLONASE) 50 MCG/ACT nasal spray 50 sprays as directed.    . Loratadine (CLARITIN PO) Take by mouth daily.    . Multiple Vitamin (MULTIVITAMIN) tablet Take 1 tablet by mouth daily.    Marland Kitchen topiramate (TOPAMAX) 200 MG tablet Take 1 tablet (200 mg total) by mouth at bedtime. 90 tablet 3  . topiramate (TOPAMAX) 50 MG tablet Take 1 tablet (50 mg total) by mouth daily. In AM, in addition to the 200 mg each night. 90 tablet 3  . vitamin E 400 UNIT capsule Take 400 Units by mouth daily.     No facility-administered medications prior to visit.     PAST MEDICAL HISTORY: Past Medical History:  Diagnosis Date  . Cancer (HCC)    Skin  . Chronic kidney disease   . Headache(784.0)   . Hypertension   . Migraine without aura 07/17/2013  . Raynaud disease   . Rosacea     PAST SURGICAL HISTORY: Past Surgical History:  Procedure Laterality Date  . SKIN SURGERY      FAMILY HISTORY: Family History  Problem Relation Age of Onset  . Cancer Father   . Macular degeneration Father   . Macular degeneration Paternal Grandfather     SOCIAL HISTORY: Social History   Socioeconomic History  . Marital status: Married    Spouse name: Not on file  . Number of children: 2  . Years of education: college  . Highest education level: Not on file  Social Needs  . Financial resource strain: Not on file  . Food insecurity - worry: Not on file  . Food insecurity - inability: Not on file  . Transportation needs -  medical: Not on file  . Transportation needs - non-medical: Not on file  Occupational History  . Occupation: [employer]  Tobacco Use  . Smoking status: Never Smoker  . Smokeless tobacco: Never Used  Substance and Sexual Activity  . Alcohol use: No    Comment: Patient drinks one coke a day  . Drug use: No  . Sexual activity: Not on file  Other Topics Concern  . Not on file  Social History Narrative  . Not on file      PHYSICAL EXAM  Vitals:   09/13/17 0720  BP: 120/74  Pulse: 78  Weight: 174 lb (78.9 kg)  Height: 5\' 3"  (1.6 m)   Body mass index is 30.82 kg/m.  Generalized: Well developed, in no acute distress   Neurological examination  Mentation: Alert oriented to time, place, history taking. Follows all commands speech and language fluent Cranial nerve II-XII: Pupils were equal round reactive to light. Extraocular movements were full, visual field were full on confrontational test. Facial sensation and strength were normal. Uvula tongue midline. Head turning and shoulder shrug  were normal and symmetric. Motor: The motor testing reveals 5 over 5 strength of all 4 extremities. Good symmetric motor tone is noted throughout.  Sensory: Sensory testing is intact to soft touch on all 4 extremities. No evidence of extinction is noted.  Coordination: Cerebellar testing reveals good finger-nose-finger and heel-to-shin bilaterally.  Gait and station: Gait is normal. Tandem gait is normal. Romberg is negative. No drift is seen.  Reflexes: Deep tendon reflexes are symmetric and normal bilaterally.   DIAGNOSTIC DATA (LABS, IMAGING, TESTING) - I reviewed patient records, labs, notes, testing and imaging myself where available.    ASSESSMENT AND PLAN 46 y.o. year old female  has a past medical history of Cancer ([place]), Chronic kidney disease, Headache(784.0), Hypertension, Migraine without aura (07/17/2013), Raynaud disease, and Rosacea. here with:  1.  Migraine headache  The patients  headaches have increased in frequency as well as duration in the last several months.  We discussed potentially adding on another preventative medication however the patient was hesitant.  We also discussed retrying a triptan and she is amenable to that plan.  I have given her a prescription for sumatriptan 50 mg.  She was advised to take 1 tablet at the onset of the migraine.  She can repeat in 2 hours if needed.  I reviewed side effects of sumatriptan with the patient.  She voiced understanding.  She denied history of stroke, MI or CAD.  She is advised that if her headaches do not improve she should let us know.  She will follow-up in 6 months or sooner if needed.    Ward Givens, MSN, NP-C 09/13/2017, 7:22 AM [facility] [address] [place], [place] [address] [phone]  [phone]  I reviewed the above note and documentation by the Nurse Practitioner and agree with the history, physical exam, assessment and plan as outlined above. I was immediately available for face-to-face consultation. Star Age, MD, PhD [facility])

## 2017-09-13 NOTE — Progress Notes (Signed)
Fioricet refill Rx successfully faxed to [facility], [place].

## 2017-09-13 NOTE — Patient Instructions (Signed)
Your Plan:  Continue Topamax Retry Imitrex 50 mg at the onset of migraine. Can repeat in 2 hours if needed.  If your symptoms worsen or you develop new symptoms please let us know.   Thank you for coming to see [place] at [facility]. I hope we have been able to provide you high quality care today.  You may receive a patient satisfaction survey over the next few weeks. We would appreciate your feedback and comments so that we may continue to improve ourselves and the health of our patients.  Sumatriptan tablets What is this medicine? SUMATRIPTAN (soo ma TRIP tan) is used to treat migraines with or without aura. An aura is a strange feeling or visual disturbance that warns you of an attack. It is not used to prevent migraines. This medicine may be used for other purposes; ask your health care provider or pharmacist if you have questions. COMMON BRAND NAME(S): Imitrex, Migraine Pack What should I tell my health care provider before I take this medicine? They need to know if you have any of these conditions: -circulation problems in fingers and toes -diabetes -heart disease -high blood pressure -high cholesterol -history of irregular heartbeat -history of stroke -kidney disease -liver disease -postmenopausal or surgical removal of uterus and ovaries -seizures -smoke tobacco -stomach or intestine problems -an unusual or allergic reaction to sumatriptan, other medicines, foods, dyes, or preservatives -pregnant or trying to get pregnant -breast-feeding How should I use this medicine? Take this medicine by mouth with a glass of water. Follow the directions on the prescription label. This medicine is taken at the first symptoms of a migraine. It is not for everyday use. If your migraine headache returns after one dose, you can take another dose as directed. You must leave at least 2 hours between doses, and do not take more than 100 mg as a single dose. Do not take more than  200 mg total in any 24 hour period. If there is no improvement at all after the first dose, do not take a second dose without talking to your doctor or health care professional. Do not take your medicine more often than directed. Talk to your pediatrician regarding the use of this medicine in children. Special care may be needed. Overdosage: If you think you have taken too much of this medicine contact a poison control center or emergency room at once. NOTE: This medicine is only for you. Do not share this medicine with others. What if I miss a dose? This does not apply; this medicine is not for regular use. What may interact with this medicine? Do not take this medicine with any of the following medicines: -cocaine -ergot alkaloids like dihydroergotamine, ergonovine, ergotamine, methylergonovine -feverfew -MAOIs like Carbex, Eldepryl, Marplan, Nardil, and Parnate -other medicines for migraine headache like almotriptan, eletriptan, frovatriptan, naratriptan, rizatriptan, zolmitriptan -tryptophan This medicine may also interact with the following medications: -certain medicines for depression, anxiety, or psychotic disturbances This list may not describe all possible interactions. Give your health care provider a list of all the medicines, herbs, non-prescription drugs, or dietary supplements you use. Also tell them if you smoke, drink alcohol, or use illegal drugs. Some items may interact with your medicine. What should I watch for while using this medicine? Only take this medicine for a migraine headache. Take it if you get warning symptoms or at the start of a migraine attack. It is not for regular use to prevent migraine attacks. You may get drowsy or dizzy.  Do not drive, use machinery, or do anything that needs mental alertness until you know how this medicine affects you. To reduce dizzy or fainting spells, do not sit or stand up quickly, especially if you are an older patient. Alcohol can  increase drowsiness, dizziness and flushing. Avoid alcoholic drinks. Smoking cigarettes may increase the risk of heart-related side effects from using this medicine. If you take migraine medicines for 10 or more days a month, your migraines may get worse. Keep a diary of headache days and medicine use. Contact your healthcare professional if your migraine attacks occur more frequently. What side effects may I notice from receiving this medicine? Side effects that you should report to your doctor or health care professional as soon as possible: -allergic reactions like skin rash, itching or hives, swelling of the face, lips, or tongue -bloody or watery diarrhea -hallucination, loss of contact with reality -pain, tingling, numbness in the face, hands, or feet -seizures -signs and symptoms of a blood clot such as breathing problems; changes in vision; chest pain; severe, sudden headache; pain, swelling, warmth in the leg; trouble speaking; sudden numbness or weakness of the face, arm, or leg -signs and symptoms of a dangerous change in heartbeat or heart rhythm like chest pain; dizziness; fast or irregular heartbeat; palpitations, feeling faint or lightheaded; falls; breathing problems -signs and symptoms of a stroke like changes in vision; confusion; trouble speaking or understanding; severe headaches; sudden numbness or weakness of the face, arm, or leg; trouble walking; dizziness; loss of balance or coordination -stomach pain Side effects that usually do not require medical attention (report to your doctor or health care professional if they continue or are bothersome): -changes in taste -facial flushing -headache -muscle cramps -muscle pain -nausea, vomiting -weak or tired This list may not describe all possible side effects. Call your doctor for medical advice about side effects. You may report side effects to FDA at 1-800-FDA-1088. Where should I keep my medicine? Keep out of the reach of  children. Store at room temperature between 2 and 30 degrees C (36 and 86 degrees F). Throw away any unused medicine after the expiration date. NOTE: This sheet is a summary. It may not cover all possible information. If you have questions about this medicine, talk to your doctor, pharmacist, or health care provider.  2018 Elsevier/Gold Standard (2015-11-25 12:38:23)

## 2018-01-01 ENCOUNTER — Other Ambulatory Visit: Payer: Self-pay

## 2018-01-01 DIAGNOSIS — G43009 Migraine without aura, not intractable, without status migrainosus: Secondary | ICD-10-CM

## 2018-01-01 MED ORDER — TOPIRAMATE 50 MG PO TABS: 50.0000 mg | ORAL_TABLET | Freq: Every day | ORAL | 3 refills | Status: DC

## 2018-01-10 ENCOUNTER — Other Ambulatory Visit: Payer: Self-pay | Admitting: Adult Health

## 2018-01-10 DIAGNOSIS — G43009 Migraine without aura, not intractable, without status migrainosus: Secondary | ICD-10-CM

## 2018-01-14 NOTE — Telephone Encounter (Signed)
Confirmed that was received by pharmacy.

## 2018-01-21 ENCOUNTER — Encounter: Payer: Self-pay | Admitting: Neurology

## 2018-01-21 ENCOUNTER — Ambulatory Visit: Payer: BC Managed Care – PPO | Admitting: Neurology

## 2018-01-21 DIAGNOSIS — G43009 Migraine without aura, not intractable, without status migrainosus: Secondary | ICD-10-CM

## 2018-01-21 MED ORDER — TOPIRAMATE 50 MG PO TABS: 75.0000 mg | ORAL_TABLET | Freq: Every day | ORAL | 3 refills | Status: DC

## 2018-01-21 MED ORDER — BUTALBITAL-APAP-CAFFEINE 50-325-40 MG PO TABS: 1.0000 | ORAL_TABLET | Freq: Four times a day (QID) | ORAL | 3 refills | Status: DC | PRN

## 2018-01-21 MED ORDER — TOPIRAMATE 200 MG PO TABS: 200.0000 mg | ORAL_TABLET | Freq: Every day | ORAL | 3 refills | Status: DC
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
## 2018-01-21 NOTE — Patient Instructions (Signed)
Let's try to increase the daytime dose of your Topamax generic to 75 mg, which is 1 1/2 pills and we will keep the evening dose the same at 200 mg.

## 2018-03-13 ENCOUNTER — Ambulatory Visit: Payer: BC Managed Care – PPO | Admitting: Adult Health

## 2018-06-17 ENCOUNTER — Other Ambulatory Visit: Payer: Self-pay | Admitting: Adult Health

## 2018-06-17 DIAGNOSIS — G43009 Migraine without aura, not intractable, without status migrainosus: Secondary | ICD-10-CM

## 2018-07-24 ENCOUNTER — Ambulatory Visit: Payer: BC Managed Care – PPO | Admitting: Neurology

## 2018-07-24 ENCOUNTER — Encounter: Payer: Self-pay | Admitting: Neurology

## 2018-07-24 DIAGNOSIS — G43009 Migraine without aura, not intractable, without status migrainosus: Secondary | ICD-10-CM | POA: Diagnosis not present

## 2018-07-24 MED ORDER — BUTALBITAL-APAP-CAFFEINE 50-325-40 MG PO TABS: 1.0000 | ORAL_TABLET | Freq: Four times a day (QID) | ORAL | 3 refills | Status: DC | PRN

## 2018-07-24 MED ORDER — TOPIRAMATE 100 MG PO TABS: ORAL_TABLET | ORAL | 3 refills | Status: DC
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
## 2018-07-24 NOTE — Patient Instructions (Signed)
We will increase your topiramate to 100 mg in the morning and 200 mg at night. I adjusted your prescription.

## 2018-11-29 ENCOUNTER — Other Ambulatory Visit: Payer: Self-pay | Admitting: Neurology

## 2018-11-29 DIAGNOSIS — G43009 Migraine without aura, not intractable, without status migrainosus: Secondary | ICD-10-CM

## 2018-11-29 NOTE — Telephone Encounter (Signed)
RX for fioricet printed, will send to Dr. Rexene Alberts for approval and electronic submission.

## 2018-11-29 NOTE — Addendum Note (Signed)
Addended by: Lester [place] A on: 11/29/2018 11:57 AM   Modules accepted: Orders

## 2018-12-02 MED ORDER — BUTALBITAL-APAP-CAFFEINE 50-325-40 MG PO TABS: ORAL_TABLET | ORAL | 3 refills | Status: DC

## 2018-12-02 NOTE — Telephone Encounter (Signed)
RX for fioricet signed by Dr. Rexene Alberts. Faxed to [facility]. Received a receipt of confirmation.

## 2019-01-23 ENCOUNTER — Encounter: Payer: Self-pay | Admitting: Neurology

## 2019-01-23 ENCOUNTER — Ambulatory Visit: Payer: BC Managed Care – PPO | Admitting: Neurology

## 2019-01-23 ENCOUNTER — Other Ambulatory Visit: Payer: Self-pay

## 2019-01-23 DIAGNOSIS — G43009 Migraine without aura, not intractable, without status migrainosus: Secondary | ICD-10-CM | POA: Diagnosis not present

## 2019-01-23 MED ORDER — BUTALBITAL-APAP-CAFFEINE 50-325-40 MG PO TABS: ORAL_TABLET | ORAL | 3 refills | Status: DC
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
## 2019-01-23 NOTE — Patient Instructions (Addendum)
We will continue with your generic Topamax 100 mg in the morning and 200 at night, we can refill your prescription when it's due in September. I have renewed your prescription for as needed use of Fioricet. Your exam is stable. Please be mindful of your migraine triggers, try to get enough rest, please stay well-hydrated with water. Stress reduction! I can see you in one year. Stay well!

## 2019-06-24 ENCOUNTER — Other Ambulatory Visit: Payer: Self-pay

## 2019-06-24 DIAGNOSIS — G43009 Migraine without aura, not intractable, without status migrainosus: Secondary | ICD-10-CM

## 2019-06-24 MED ORDER — BUTALBITAL-APAP-CAFFEINE 50-325-40 MG PO TABS: ORAL_TABLET | ORAL | 3 refills | Status: DC

## 2019-07-14 ENCOUNTER — Other Ambulatory Visit: Payer: Self-pay | Admitting: Neurology

## 2019-07-14 DIAGNOSIS — G43009 Migraine without aura, not intractable, without status migrainosus: Secondary | ICD-10-CM

## 2019-10-27 ENCOUNTER — Other Ambulatory Visit: Payer: Self-pay | Admitting: Neurology

## 2019-10-27 DIAGNOSIS — G43009 Migraine without aura, not intractable, without status migrainosus: Secondary | ICD-10-CM

## 2020-01-11 ENCOUNTER — Ambulatory Visit: Payer: BC Managed Care – PPO | Attending: Internal Medicine

## 2020-01-11 DIAGNOSIS — Z23 Encounter for immunization: Secondary | ICD-10-CM | POA: Insufficient documentation

## 2020-01-11 NOTE — Progress Notes (Signed)
   Covid-19 Vaccination Clinic  Name:  Mary Richard    MRN: [id number] DOB: [birth year]  01/11/2020  [name] was observed post Covid-19 immunization for 15 minutes without incident. She was provided with Vaccine Information Sheet and instruction to access the V-Safe system.   [name] was instructed to call 911 with any severe reactions post vaccine: Marland Kitchen Difficulty breathing  . Swelling of face and throat  . A fast heartbeat  . A bad rash all over body  . Dizziness and weakness   Immunizations Administered    Name Date Dose VIS Date Route   Pfizer COVID-19 Vaccine 01/11/2020 11:49 AM 0.3 mL 10/17/2019 Intramuscular   Manufacturer: [place]   Lot: [id number]   [place]: [id number]

## 2020-01-21 ENCOUNTER — Other Ambulatory Visit: Payer: Self-pay

## 2020-01-21 DIAGNOSIS — G43009 Migraine without aura, not intractable, without status migrainosus: Secondary | ICD-10-CM

## 2020-01-21 MED ORDER — BUTALBITAL-APAP-CAFFEINE 50-325-40 MG PO TABS: ORAL_TABLET | ORAL | 0 refills | Status: DC

## 2020-01-29 ENCOUNTER — Ambulatory Visit: Payer: BC Managed Care – PPO | Admitting: Neurology

## 2020-01-29 ENCOUNTER — Other Ambulatory Visit: Payer: Self-pay

## 2020-01-29 ENCOUNTER — Encounter: Payer: Self-pay | Admitting: Neurology

## 2020-01-29 VITALS — BP 122/85 | HR 74 | Temp 98.5°F | Ht 63.0 in | Wt 188.5 lb

## 2020-01-29 DIAGNOSIS — G43019 Migraine without aura, intractable, without status migrainosus: Secondary | ICD-10-CM

## 2020-01-29 MED ORDER — UBRELVY 50 MG PO TABS: 50.0000 mg | ORAL_TABLET | ORAL | 3 refills | Status: DC | PRN
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
## 2020-01-29 NOTE — Patient Instructions (Addendum)
We may be able to eventually stop the fioricet. As discussed, we will start for acute migraine use:   Ubrelvy, 50 mg strength: Take 1 pill at onset of migraine headache, may repeat in 2 hours, no more than 4 pills in 24 hours, i.e. not to exceed 200 mg per 24 hours. May cause sedation and nausea.   We can consider Nurtec 75 mg if needed down the road.   You are a good candidate for one of the newer injectable prevention medications, such as Aimovig, Ajovy and Emgality.   As discussed, we will continue migraine prevention with topamax for now. Please follow-up in about 3 months to see the nurse practitioner.

## 2020-02-02 ENCOUNTER — Telehealth: Payer: Self-pay

## 2020-02-02 NOTE — Telephone Encounter (Signed)
Received request for PA for ubrelvy from [place]. Completed via covermymeds. Key: [id number].  "Your information has been submitted to [place]. To check for an updated outcome later, reopen this PA request from your dashboard.  If [facility] has not responded to your request within 24 hours, contact [place] at [phone]. If you think there may be a problem with your PA request, use our live chat feature at the bottom right."

## 2020-02-03 NOTE — Telephone Encounter (Signed)
PA for Mary Richard was approved by [facility] from 02/02/2020-02/01/2021. PA # [id number].  [facility] informed.

## 2020-02-11 ENCOUNTER — Ambulatory Visit: Payer: BC Managed Care – PPO | Attending: Internal Medicine

## 2020-02-11 DIAGNOSIS — Z23 Encounter for immunization: Secondary | ICD-10-CM

## 2020-02-11 NOTE — Progress Notes (Signed)
   Covid-19 Vaccination Clinic  Name:  HUGO DURAND    MRN: [id number] DOB: [birth year]  02/11/2020  Ms. Laton was observed post Covid-19 immunization for 15 minutes without incident. She was provided with Vaccine Information Sheet and instruction to access the V-Safe system.   Ms. Gerbitz was instructed to call 911 with any severe reactions post vaccine: Marland Kitchen Difficulty breathing  . Swelling of face and throat  . A fast heartbeat  . A bad rash all over body  . Dizziness and weakness   Immunizations Administered    Name Date Dose VIS Date Route   Pfizer COVID-19 Vaccine 02/11/2020  2:54 PM 0.3 mL 10/17/2019 Intramuscular   Manufacturer: [place]   Lot: [id number]   [place]: [id number]

## 2020-03-31 ENCOUNTER — Other Ambulatory Visit: Payer: Self-pay

## 2020-03-31 DIAGNOSIS — G43009 Migraine without aura, not intractable, without status migrainosus: Secondary | ICD-10-CM

## 2020-03-31 MED ORDER — BUTALBITAL-APAP-CAFFEINE 50-325-40 MG PO TABS: ORAL_TABLET | ORAL | 0 refills | Status: DC

## 2020-03-31 NOTE — Telephone Encounter (Signed)
As discussed during our last visit, we will try to phase out of the Fioricet for as needed use for her migraines and rely more on using Ubrelvy as needed.  I have renewed her Fioricet prescription for 10 pills.

## 2020-05-06 ENCOUNTER — Encounter: Payer: Self-pay | Admitting: Adult Health

## 2020-05-06 ENCOUNTER — Ambulatory Visit: Payer: BC Managed Care – PPO | Admitting: Adult Health

## 2020-05-06 VITALS — BP 113/77 | HR 73 | Ht 63.0 in | Wt 186.4 lb

## 2020-05-06 DIAGNOSIS — G43009 Migraine without aura, not intractable, without status migrainosus: Secondary | ICD-10-CM

## 2020-05-06 MED ORDER — AJOVY 225 MG/1.5ML ~~LOC~~ SOAJ: 225.0000 mg | SUBCUTANEOUS | 5 refills | Status: DC

## 2020-05-06 MED ORDER — UBRELVY 100 MG PO TABS: ORAL_TABLET | ORAL | 5 refills | Status: DC

## 2020-05-06 NOTE — Progress Notes (Addendum)
PATIENT: Mary Richard DOB: [birth year]  REASON FOR VISIT: follow up HISTORY FROM: patient  HISTORY OF PRESENT ILLNESS: Today 05/06/20:  Mary Richard is a 49 year old female with a history of migraine headaches.  She is currently on Topamax taking 1 tablet in the morning and 2 tablets at bedtime.  She was given Ubrelvy at the last visit.  She reports that she typically has to take both tablets of Ubrelvy to get benefit.  She reports that she is having approximately 7 headaches a month.  HISTORY She reports having about 5 or 6 migraines per month.  She does have quite a bit of stress due to elderly in-laws.  She tries to hydrate well with water.  She got her first Covid vaccine dose recently and is due for the second dose on 02/11/2020.  She would be interested in trying one of the newer medications.  We talked about this before.  She continues to take Fioricet as needed and while she has not overused it, she does require a refill on this on a fairly regular basis.  She continues to take fairly high dose of Topamax and we talked about potentially switching her to one of the newer injectables.  Her eye exam is up-to-date as of January 2021.  REVIEW OF SYSTEMS: Out of a complete 14 system review of symptoms, the patient complains only of the following symptoms, and all other reviewed systems are negative.  ALLERGIES: Allergies  Allergen Reactions  . Flagyl [Metronidazole]   . Ibuprofen   . Neosporin [Neomycin-Bacitracin Zn-Polymyx]     HOME MEDICATIONS: Outpatient Medications Prior to Visit  Medication Sig Dispense Refill  . butalbital-acetaminophen-caffeine (FIORICET) 50-325-40 MG tablet TAKE 1 TABLET BY MOUTH EVERY 6 HOURS AS NEEDED FOR HEADACHE 10 tablet 0  . Cholecalciferol (VITAMIN D) 2000 UNITS CAPS Take 2,000 capsules by mouth daily.    Marland Kitchen diltiazem (CARDIZEM SR) 90 MG 12 hr capsule     . Fish Oil OIL Take 3,000 mg by mouth daily.    . fluticasone (FLONASE) 50 MCG/ACT nasal spray 50  sprays as directed.    . Loratadine (CLARITIN PO) Take by mouth daily.    . Multiple Vitamin (MULTIVITAMIN) tablet Take 1 tablet by mouth daily.    Marland Kitchen topiramate (TOPAMAX) 100 MG tablet TAKE 1 TABLET BY MOUTH EVERY MORNING AND 2 TABLETS AT BEDTIME 270 tablet 3  . Ubrogepant (UBRELVY) 50 MG TABS Take 50 mg by mouth as needed (may repeat once in 2 hours. no more than 2 pills in 24 h.). 10 tablet 3  . vitamin E 400 UNIT capsule Take 400 Units by mouth daily.     No facility-administered medications prior to visit.    PAST MEDICAL HISTORY: Past Medical History:  Diagnosis Date  . Cancer (HCC)    Skin  . Chronic kidney disease   . Headache(784.0)   . Hypertension   . Migraine without aura 07/17/2013  . Raynaud disease   . Rosacea     PAST SURGICAL HISTORY: Past Surgical History:  Procedure Laterality Date  . SKIN SURGERY      FAMILY HISTORY: Family History  Problem Relation Age of Onset  . Cancer Father   . Macular degeneration Father   . Macular degeneration Paternal Grandfather     SOCIAL HISTORY: Social History   Socioeconomic History  . Marital status: Married    Spouse name: Not on file  . Number of children: 2  . Years of education: college  .  Highest education level: Not on file  Occupational History  . Occupation: [employer]  Tobacco Use  . Smoking status: Never Smoker  . Smokeless tobacco: Never Used  Substance and Sexual Activity  . Alcohol use: No    Comment: Patient drinks one coke a day  . Drug use: No  . Sexual activity: Not on file  Other Topics Concern  . Not on file  Social History Narrative  . Not on file   Social Determinants of Health   Financial Resource Strain:   . Difficulty of Paying Living Expenses:   Food Insecurity:   . Worried About Charity fundraiser in the Last Year:   . Arboriculturist in the Last Year:   Transportation Needs:   . Film/video editor (Medical):   Marland Kitchen Lack of Transportation (Non-Medical):   Physical Activity:    . Days of Exercise per Week:   . Minutes of Exercise per Session:   Stress:   . Feeling of Stress :   Social Connections:   . Frequency of Communication with Friends and Family:   . Frequency of Social Gatherings with Friends and Family:   . Attends Religious Services:   . Active Member of Clubs or Organizations:   . Attends Archivist Meetings:   Marland Kitchen Marital Status:   Intimate Partner Violence:   . Fear of Current or Ex-Partner:   . Emotionally Abused:   Marland Kitchen Physically Abused:   . Sexually Abused:       PHYSICAL EXAM  Vitals:   05/06/20 1422  Weight: 186 lb 6.4 oz (84.6 kg)  Height: 5\' 3"  (1.6 m)   Body mass index is 33.02 kg/m.  Generalized: Well developed, in no acute distress   Neurological examination  Mentation: Alert oriented to time, place, history taking. Follows all commands speech and language fluent Cranial nerve II-XII: Pupils were equal round reactive to light. Extraocular movements were full, visual field were full on confrontational test. . Head turning and shoulder shrug  were normal and symmetric. Motor: The motor testing reveals 5 over 5 strength of all 4 extremities. Good symmetric motor tone is noted throughout.  Sensory: Sensory testing is intact to soft touch on all 4 extremities. No evidence of extinction is noted.  Coordination: Cerebellar testing reveals good finger-nose-finger and heel-to-shin bilaterally.  Gait and station: Gait is normal. Reflexes: Deep tendon reflexes are symmetric and normal bilaterally.   DIAGNOSTIC DATA (LABS, IMAGING, TESTING) - I reviewed patient records, labs, notes, testing and imaging myself where available.     ASSESSMENT AND PLAN 49 y.o. year old female  has a past medical history of Cancer ([place]), Chronic kidney disease, Headache(784.0), Hypertension, Migraine without aura (07/17/2013), Raynaud disease, and Rosacea. here with  Migraine headaches    Continue Topamax  Increase Ubrelvy 200 mg advised  that she can take 1 tablet at the onset of a migraine he can repeat in 2 hours if needed  Start [place] if her symptoms worsen or she develops new symptoms she should let us know  Follow-up in 6 months or sooner if needed     I spent 20 minutes of face-to-face and non-face-to-face time with patient.  This included previsit chart review, lab review, study review, order entry, electronic health record documentation, patient education.  Ward Givens, MSN, NP-C 05/06/2020, 2:24 PM [facility] [address] [place], [place] [address] [phone]  I reviewed the above note and documentation by the Nurse  Practitioner and agree with the history, exam, assessment and plan as outlined above. I was available for consultation. Star Age, MD, PhD [facility])

## 2020-05-06 NOTE — Patient Instructions (Signed)
Your Plan:  Continue Topamax Ubrelvy increase to 100 mg- take 1 tablet at the onset of migraine. Can repeat in 2 hours if needed. Only 2 tabs in 24 hours Start Ajovy If your symptoms worsen or you develop new symptoms please let us know.    Thank you for coming to see [place] at [facility]. I hope we have been able to provide you high quality care today.  You may receive a patient satisfaction survey over the next few weeks. We would appreciate your feedback and comments so that we may continue to improve ourselves and the health of our patients.  Fremanezumab injection What is this medicine? FREMANEZUMAB (fre ma NEZ ue mab) is used to prevent migraine headaches. This medicine may be used for other purposes; ask your health care provider or pharmacist if you have questions. COMMON BRAND NAME(S): AJOVY What should I tell my health care provider before I take this medicine? They need to know if you have any of these conditions:  an unusual or allergic reaction to fremanezumab, other medicines, foods, dyes, or preservatives  pregnant or trying to get pregnant  breast-feeding How should I use this medicine? This medicine is for injection under the skin. You will be taught how to prepare and give this medicine. Use exactly as directed. Take your medicine at regular intervals. Do not take your medicine more often than directed. It is important that you put your used needles and syringes in a special sharps container. Do not put them in a trash can. If you do not have a sharps container, call your pharmacist or healthcare provider to get one. Talk to your pediatrician regarding the use of this medicine in children. Special care may be needed. Overdosage: If you think you have taken too much of this medicine contact a poison control center or emergency room at once. NOTE: This medicine is only for you. Do not share this medicine with others. What if I miss a dose? If you miss a  dose, take it as soon as you can. If it is almost time for your next dose, take only that dose. Do not take double or extra doses. What may interact with this medicine? Interactions are not expected. This list may not describe all possible interactions. Give your health care provider a list of all the medicines, herbs, non-prescription drugs, or dietary supplements you use. Also tell them if you smoke, drink alcohol, or use illegal drugs. Some items may interact with your medicine. What should I watch for while using this medicine? Tell your doctor or healthcare professional if your symptoms do not start to get better or if they get worse. What side effects may I notice from receiving this medicine? Side effects that you should report to your doctor or health care professional as soon as possible:  allergic reactions like skin rash, itching or hives, swelling of the face, lips, or tongue Side effects that usually do not require medical attention (report these to your doctor or health care professional if they continue or are bothersome):  pain, redness, or irritation at site where injected This list may not describe all possible side effects. Call your doctor for medical advice about side effects. You may report side effects to FDA at 1-800-FDA-1088. Where should I keep my medicine? Keep out of the reach of children. You will be instructed on how to store this medicine. Throw away any unused medicine after the expiration date on the label. NOTE: This sheet is  a summary. It may not cover all possible information. If you have questions about this medicine, talk to your doctor, pharmacist, or health care provider.  2020 Elsevier/Gold Standard (2017-07-23 17:22:56)

## 2020-05-11 ENCOUNTER — Encounter: Payer: Self-pay | Admitting: Adult Health

## 2020-05-11 NOTE — Telephone Encounter (Signed)
PA started for Ubrelvy 100mg  on [email or web address]. Key is [id number]. Information has been sent to [facility], just waiting on the question set. Will answer the PA questions once they are available.

## 2020-05-14 ENCOUNTER — Other Ambulatory Visit: Payer: Self-pay | Admitting: Neurology

## 2020-05-14 DIAGNOSIS — G43009 Migraine without aura, not intractable, without status migrainosus: Secondary | ICD-10-CM

## 2020-05-17 MED ORDER — BUTALBITAL-APAP-CAFFEINE 50-325-40 MG PO TABS: ORAL_TABLET | ORAL | 0 refills | Status: DC

## 2020-05-17 NOTE — Telephone Encounter (Signed)
Received APPROVAL for UBRELVY 05-17-20 thru 05-17-2021  [id number]. Fax confirmation received [facility] [phone].

## 2020-05-17 NOTE — Telephone Encounter (Signed)
Approval for UBRELVY 100mg  tabs 05-17-20 thru 05-17-2021 [facility] [phone].

## 2020-05-17 NOTE — Telephone Encounter (Signed)
I called pt and let her know that the [facility] was notified that approval for ubrelvy. She verbalized understanding.

## 2020-06-22 ENCOUNTER — Other Ambulatory Visit: Payer: Self-pay | Admitting: Adult Health

## 2020-06-22 DIAGNOSIS — G43009 Migraine without aura, not intractable, without status migrainosus: Secondary | ICD-10-CM

## 2020-06-22 NOTE — Telephone Encounter (Signed)
Please call patient and advise her that we should try to utilize the [place] for migraine management acutely and try to phase out of using the Fioricet.  In addition, at the visit with Jinny Blossom last month she was supposed to start [place] for migraine prevention with monthly injections.  Please find out if she has start the Ajovy injections.

## 2020-06-23 ENCOUNTER — Telehealth: Payer: Self-pay | Admitting: Neurology

## 2020-06-23 MED ORDER — NURTEC 75 MG PO TBDP: 75.0000 mg | ORAL_TABLET | Freq: Once | ORAL | 3 refills | Status: DC | PRN

## 2020-06-23 NOTE — Telephone Encounter (Signed)
Please start prior authorization for Nurtec.  Prescription has been placed.  Ubrelvy at 100 mg strength, even after 2 doses has not been effective enough.

## 2020-06-23 NOTE — Telephone Encounter (Signed)
Sent [employer], but will try call her as well.

## 2020-06-24 NOTE — Telephone Encounter (Signed)
Received the question set and submitted information via [email or web address]. Per [email or web address], determination will be received with 1-3 business days. Will check back for updates.

## 2020-06-24 NOTE — Telephone Encounter (Signed)
Received PA request for Nurtec 75mg . PA completed on [email or web address]. Key is [id number]. Demographic information was submitted but did not receive a question set.   Will check back later today for the question set.

## 2020-06-28 NOTE — Telephone Encounter (Signed)
PA for Nurtec has been approved 06/24/2020 to 06/24/2021. Will fax a copy to pharmacy and send patient a MyChart message to let her know.

## 2020-07-13 ENCOUNTER — Encounter: Payer: Self-pay | Admitting: Neurology

## 2020-07-14 ENCOUNTER — Other Ambulatory Visit: Payer: Self-pay | Admitting: Internal Medicine

## 2020-07-14 ENCOUNTER — Ambulatory Visit
Admission: RE | Admit: 2020-07-14 | Discharge: 2020-07-14 | Disposition: A | Discharge status: Home / Self Care | Payer: BC Managed Care – PPO | Source: Ambulatory Visit | Attending: Internal Medicine | Admitting: Internal Medicine

## 2020-07-14 DIAGNOSIS — M25561 Pain in right knee: Secondary | ICD-10-CM

## 2020-07-15 ENCOUNTER — Telehealth: Payer: Self-pay | Admitting: Neurology

## 2020-07-15 MED ORDER — GABAPENTIN 100 MG PO CAPS: ORAL_CAPSULE | ORAL | 3 refills | Status: DC

## 2020-07-15 NOTE — Telephone Encounter (Signed)
My chart message sent with Dr. Guadelupe Sabin recommendations and instructions for gabapentin.  Pt was advised to call back if she had any questions/concerns.

## 2020-07-15 NOTE — Addendum Note (Signed)
Addended by: Star Age on: 07/15/2020 04:58 PM   Modules accepted: Orders

## 2020-07-15 NOTE — Telephone Encounter (Signed)
I have sent a my chart message inquiring on the questions from Dr. Rexene Alberts. Waiting on response.

## 2020-07-15 NOTE — Telephone Encounter (Signed)
Rx done, please email her back:  Start Neurontin (gabapentin) 100 mg strength: Take 1 pill nightly at bedtime for 1 week, then 2 pills nightly for 1 week, then 3 pills each night thereafter. The most common side effects reported are sedation or sleepiness. Rare side effects include balance problems, confusion.

## 2020-07-15 NOTE — Telephone Encounter (Signed)
Pt responded to my message. She reports she is currently taking the Cardizem every other day as recommended by her PCP for her BP And Raynaud's disease. She sts pcp felt she would be best treated on this dosage. She also reports she tried an antidepressant in the past but d/c due to increased tiredness and weight gain. She was then switched to topmax and has been on med since. She sts she is not interested in botox due to this issues with the previous injections tried. Pt sts she thought a replacement for Fioricet was being discussed? Does she need to be taken of topamax?

## 2020-07-15 NOTE — Telephone Encounter (Signed)
Please see my chart message for reference.  Please ask patient if she has ever been on:  propranolol or amitriptyline or nortriptyline for her migraine prevention. She is currently on Topamax and has been on it for years.  She may have tried other medications through Dr. Domingo Cocking.  Please check with her if she has tried any of these other medications. Propranolol can affect the blood pressure and lower blood pressure and pulse.  I can see where she has a prescription for a calcium channel blocker - Cardizem, which is also used for migraine prevention incidentally.  It is written for every other day, can she possibly take it every day?  Another option would be to request insurance authorization for Botox injections for migraine prevention.  She may be a good candidate for this as she has tried triptans for acute use in the past and she has trialed Aimovig, Ajovy, Nurtec and [place].  We can request authorization through her insurance for 3 monthly Botox injections which work well for migraine prevention.

## 2020-07-15 NOTE — Telephone Encounter (Signed)
She does not need to stop the topamax, but I am running out of options for the as needed medication. The thinking here was if we optimize the preventative, we may not need as much of the as needed meds.  Please clarify that Botox is completely different from the CGRP receptor blocker injections.   We can consider a couple of things:   1. If she does not want to consider botox, we can try gabapentin for prevention, may add to the topamax.  2. Second opinion with Dr. Jaynee Eagles at [facility], for HA management advice or referral to a headache center at [facility] or [facility] or [facility]

## 2020-07-15 NOTE — Telephone Encounter (Signed)
I reached out to the pt and we discussed Dr. Guadelupe Sabin recommendations. Pt is interested in trying gabapentin. She sts at this point she is not interested in a second opinion or botox.

## 2020-07-16 ENCOUNTER — Other Ambulatory Visit: Payer: Self-pay | Admitting: Neurology

## 2020-07-16 DIAGNOSIS — G43009 Migraine without aura, not intractable, without status migrainosus: Secondary | ICD-10-CM

## 2020-08-25 ENCOUNTER — Encounter: Payer: Self-pay | Admitting: Adult Health

## 2020-08-25 NOTE — Telephone Encounter (Signed)
Please call and offer her an appointment with me- can be Virtual

## 2020-08-26 ENCOUNTER — Telehealth: Payer: Self-pay | Admitting: Adult Health

## 2020-08-27 ENCOUNTER — Telehealth (INDEPENDENT_AMBULATORY_CARE_PROVIDER_SITE_OTHER): Payer: BC Managed Care – PPO | Admitting: Adult Health

## 2020-08-27 DIAGNOSIS — G43019 Migraine without aura, intractable, without status migrainosus: Secondary | ICD-10-CM

## 2020-08-27 MED ORDER — FROVATRIPTAN SUCCINATE 2.5 MG PO TABS: ORAL_TABLET | ORAL | 0 refills | Status: DC

## 2020-08-27 NOTE — Progress Notes (Addendum)
  [facility] [address] [place]. [place] [address] [phone]     Virtual Visit via Telephone Note  I connected with Percell Miller on 08/27/20 at 11:00 AM EDT by telephone located remotely at [facility] and verified that I am speaking with the correct person using two identifiers who reports being located at home.   Visit scheduled by CMA-Cherina. She discussed the limitations, risks, security and privacy concerns of performing an evaluation and management service by telephone and the availability of in person appointments. I also discussed with the patient that there may be a patient responsible charge related to this service. The patient expressed understanding and agreed to proceed. See telephone note for consent and additional scheduling information.    History of Present Illness:  Mary Richard is a 49 y.o. female who has been followed in this office for migraine headache.  She reports that she had to stop Ajovy and Nurtec because of hair loss and muscle aches.  She is now only taking Topamax and gabapentin.  She states that the majority of the month she does well with her migraines.  She states that during her menstrual cycle she will get 2-3 headaches.  Sometimes the headaches may last 1 to 2 days.  In the past she has been on Fioricet and it works well to resolve her headaches however she was taking up to 20 tablets a day.  In the past she has been on sumatriptan and reports that she tolerated well however it quit working for her headaches.  She returns today for an evaluation.    Observations/Objective:  Generalized: Well developed, in no acute distress   Neurological examination  Mentation: Alert oriented to time, place, history taking. Follows all commands speech and language fluent  Assessment and Plan:  1: Migraine headaches   Continue gabapentin and Topamax  Start frovatriptan for abortive therapy.  Can take at the onset of a  migraine and repeat in 2 hours if needed.  Did advise the patient that if her menstrual cycles are regular that she can try taking 1 tablet daily for the first 1 to 2 days of her cycle to prevent headaches.  We will keep her follow-up in January.  Advised to let us know if this is not beneficial for headaches.   Follow Up Instructions:   F/U in january    I discussed the assessment and treatment plan with the patient.  The patient was provided an opportunity to ask questions and all were answered to their satisfaction. The patient agreed with the plan and verbalized an understanding of the instructions.   I spent 25 minutes of telephone and non-face-to-face time with patient.  This included previsit chart review, lab review, study review, order entry, electronic health record documentation, patient education.     Ward Givens NP-C  [facility] [address] [place], [place] [address]  Phone [phone] Fax [phone]  I reviewed the above note and documentation by the Nurse Practitioner and agree with the history, exam, assessment and plan as outlined above. I was available for consultation. Star Age, MD, PhD [facility])

## 2020-09-14 ENCOUNTER — Ambulatory Visit: Payer: BC Managed Care – PPO | Attending: Internal Medicine

## 2020-09-14 DIAGNOSIS — Z23 Encounter for immunization: Secondary | ICD-10-CM

## 2020-09-14 NOTE — Progress Notes (Signed)
   Covid-19 Vaccination Clinic  Name:  Mary Richard    MRN: [id number] DOB: Mar 07, 1971  09/14/2020  [name] was observed post Covid-19 immunization for 15 minutes without incident. She was provided with Vaccine Information Sheet and instruction to access the V-Safe system.   [name] was instructed to call 911 with any severe reactions post vaccine: Marland Kitchen Difficulty breathing  . Swelling of face and throat  . A fast heartbeat  . A bad rash all over body  . Dizziness and weakness

## 2020-09-21 ENCOUNTER — Encounter: Payer: Self-pay | Admitting: Adult Health

## 2020-09-21 DIAGNOSIS — G43009 Migraine without aura, not intractable, without status migrainosus: Secondary | ICD-10-CM

## 2020-09-23 MED ORDER — BUTALBITAL-APAP-CAFFEINE 50-325-40 MG PO TABS: ORAL_TABLET | ORAL | 3 refills | Status: DC

## 2020-11-10 ENCOUNTER — Encounter: Payer: Self-pay | Admitting: Adult Health

## 2020-11-10 ENCOUNTER — Ambulatory Visit: Payer: BC Managed Care – PPO | Admitting: Adult Health

## 2020-11-10 DIAGNOSIS — G43009 Migraine without aura, not intractable, without status migrainosus: Secondary | ICD-10-CM

## 2020-11-10 MED ORDER — GABAPENTIN 300 MG PO CAPS: 300.0000 mg | ORAL_CAPSULE | Freq: Every day | ORAL | 3 refills | Status: DC

## 2020-11-10 MED ORDER — BUTALBITAL-APAP-CAFFEINE 50-325-40 MG PO TABS: ORAL_TABLET | ORAL | 5 refills | Status: DC

## 2020-11-10 NOTE — Progress Notes (Addendum)
PATIENT: Mary Richard DOB: 12-15-1970  REASON FOR VISIT: follow up HISTORY FROM: patient  HISTORY OF PRESENT ILLNESS: Today 11/10/20:  Mary Richard is a 50 year old female with a history of migraine headaches.  She returns today for follow-up.  She reports that her headaches are under good control.  She only has headaches during her menstrual cycle.  She may have 2-4 headaches during this time.  She typically can take Fioricet and her headache resolves fairly quickly.  She continues to take gabapentin 300 mg at bedtime and Topamax 100 mg in the morning and 200 mg in the evening.  In the past she has tried [place] with little benefit.  She returns today for an evaluation.  10/22/21[name] is a 50 y.o. female who has been followed in this office for/ migraine headache.  She reports that she had to stop Ajovy and Nurtec because of hair loss and muscle aches.  She is now only taking Topamax and gabapentin.  She states that the majority of the month she does well with her migraines.  She states that during her menstrual cycle she will get 2-3 headaches.  Sometimes the headaches may last 1 to 2 days.  In the past she has been on Fioricet and it works well to resolve her headaches however she was taking up to 20 tablets a day.  In the past she has been on sumatriptan and reports that she tolerated well however it quit working for her headaches.  She returns today for an evaluation.   HISTORY She reports having about 5 or 6 migraines per month.  She does have quite a bit of stress due to elderly in-laws.  She tries to hydrate well with water.  She got her first Covid vaccine dose recently and is due for the second dose on 02/11/2020.  She would be interested in trying one of the newer medications.  We talked about this before.  She continues to take Fioricet as needed and while she has not overused it, she does require a refill on this on a fairly regular basis.  She continues to take fairly high dose  of Topamax and we talked about potentially switching her to one of the newer injectables.  Her eye exam is up-to-date as of January 2021.  REVIEW OF SYSTEMS: Out of a complete 14 system review of symptoms, the patient complains only of the following symptoms, and all other reviewed systems are negative.  See HPI  ALLERGIES: Allergies  Allergen Reactions  . Flagyl [Metronidazole]   . Ibuprofen   . Neosporin [Neomycin-Bacitracin Zn-Polymyx]     HOME MEDICATIONS: Outpatient Medications Prior to Visit  Medication Sig Dispense Refill  . butalbital-acetaminophen-caffeine (FIORICET) 50-325-40 MG tablet TAKE 1 TABLET BY MOUTH EVERY 6 HOURS AS NEEDED FOR HEADACHE 8 tablet 3  . Cholecalciferol (VITAMIN D) 2000 UNITS CAPS Take 2,000 capsules by mouth daily.    Marland Kitchen diltiazem (CARDIZEM SR) 90 MG 12 hr capsule 90 mg every other day. Per patient, every other day.    . Fish Oil OIL Take 3,000 mg by mouth daily.    . fluticasone (FLONASE) 50 MCG/ACT nasal spray 50 sprays as directed.    . gabapentin (NEURONTIN) 100 MG capsule Take 1 pill nightly at bedtime for 1 week, then 2 pills nightly for 1 week, then 3 pills each night thereafter. (Patient taking differently: 3 tabs daily) 90 capsule 3  . Loratadine (CLARITIN PO) Take by mouth daily.    Marland Kitchen  Multiple Vitamin (MULTIVITAMIN) tablet Take 1 tablet by mouth daily.    Marland Kitchen topiramate (TOPAMAX) 100 MG tablet TAKE 1 TABLET BY MOUTH EVERY MORNING AND 2 TABLETS AT BEDTIME 270 tablet 3  . vitamin E 400 UNIT capsule Take 400 Units by mouth daily.    . Fremanezumab-vfrm (AJOVY) 225 MG/1.5ML SOAJ Inject 225 mg into the skin every 30 (thirty) days. 1.5 mL 5  . frovatriptan (FROVA) 2.5 MG tablet Take 1 tablet at the onset of migraine- can repeat in 2 hours if needed. 2 tabs/24 hours 10 tablet 0  . Rimegepant Sulfate (NURTEC) 75 MG TBDP Take 75 mg by mouth once as needed for up to 1 dose. May repeat once after 2 hours 10 tablet 3  . Ubrogepant (UBRELVY) 100 MG TABS Take 1  tablet at the onset of migraine. Can repeat in 2 hours if needed. Only 2tabs/24 hours 30 tablet 5   No facility-administered medications prior to visit.    PAST MEDICAL HISTORY: Past Medical History:  Diagnosis Date  . Cancer (HCC)    Skin  . Chronic kidney disease   . Headache(784.0)   . Hypertension   . Migraine without aura 07/17/2013  . Raynaud disease   . Rosacea     PAST SURGICAL HISTORY: Past Surgical History:  Procedure Laterality Date  . SKIN SURGERY      FAMILY HISTORY: Family History  Problem Relation Age of Onset  . Cancer Father   . Macular degeneration Father   . Macular degeneration Paternal Grandfather     SOCIAL HISTORY: Social History   Socioeconomic History  . Marital status: Married    Spouse name: Not on file  . Number of children: 2  . Years of education: college  . Highest education level: Not on file  Occupational History  . Occupation: [employer]  Tobacco Use  . Smoking status: Never Smoker  . Smokeless tobacco: Never Used  Substance and Sexual Activity  . Alcohol use: No    Comment: Patient drinks one coke a day  . Drug use: No  . Sexual activity: Not on file  Other Topics Concern  . Not on file  Social History Narrative  . Not on file   Social Determinants of Health   Financial Resource Strain: Not on file  Food Insecurity: Not on file  Transportation Needs: Not on file  Physical Activity: Not on file  Stress: Not on file  Social Connections: Not on file  Intimate Partner Violence: Not on file      PHYSICAL EXAM  Vitals:   11/10/20 1335  BP: (!) 141/77  Pulse: 79  Weight: 192 lb (87.1 kg)  Height: 5\' 3"  (1.6 m)   Body mass index is 34.01 kg/m.  Generalized: Well developed, in no acute distress   Neurological examination  Mentation: Alert oriented to time, place, history taking. Follows all commands speech and language fluent Cranial nerve II-XII: Pupils were equal round reactive to light. Extraocular movements  were full, visual field were full on confrontational test. . Head turning and shoulder shrug  were normal and symmetric. Motor: The motor testing reveals 5 over 5 strength of all 4 extremities. Good symmetric motor tone is noted throughout.  Sensory: Sensory testing is intact to soft touch on all 4 extremities. No evidence of extinction is noted.  Coordination: Cerebellar testing reveals good finger-nose-finger and heel-to-shin bilaterally.  Gait and station: Gait is normal. Reflexes: Deep tendon reflexes are symmetric and normal bilaterally.   DIAGNOSTIC DATA (LABS,  IMAGING, TESTING) - I reviewed patient records, labs, notes, testing and imaging myself where available.     ASSESSMENT AND PLAN 50 y.o. year old female  has a past medical history of Cancer ([place]), Chronic kidney disease, Headache(784.0), Hypertension, Migraine without aura (07/17/2013), Raynaud disease, and Rosacea. here with  Migraine headaches    Continue Topamax 100 mg in the morning 200 mg in the evening  Continue gabapentin 300 mg at bedtime  Take Fioricet at the onset of a migraine.  Follow-up in 6 months or sooner if needed   I spent 20 minutes of face-to-face and non-face-to-face time with patient.  This included previsit chart review, lab review, study review, order entry, electronic health record documentation, patient education.  Ward Givens, MSN, NP-C 11/10/2020, 1:41 PM [facility] [address] [place], [place] [address] [phone]   I reviewed the above note and documentation by the Nurse Practitioner and agree with the history, exam, assessment and plan as outlined above. I was available for consultation. Star Age, MD, PhD [facility])

## 2020-11-10 NOTE — Patient Instructions (Signed)
Your Plan:  Continue Topamax and gabapentin  Continue Fioricet  If your symptoms worsen or you develop new symptoms please let us know.   Thank you for coming to see [place] at [facility]. I hope we have been able to provide you high quality care today.  You may receive a patient satisfaction survey over the next few weeks. We would appreciate your feedback and comments so that we may continue to improve ourselves and the health of our patients.

## 2020-11-16 ENCOUNTER — Other Ambulatory Visit: Payer: Self-pay | Admitting: Neurology

## 2020-12-08 ENCOUNTER — Other Ambulatory Visit: Payer: Self-pay | Admitting: Internal Medicine

## 2020-12-08 ENCOUNTER — Ambulatory Visit
Admission: RE | Admit: 2020-12-08 | Discharge: 2020-12-08 | Disposition: A | Discharge status: Home / Self Care | Payer: BC Managed Care – PPO | Source: Ambulatory Visit | Attending: Internal Medicine | Admitting: Internal Medicine

## 2020-12-08 DIAGNOSIS — R059 Cough, unspecified: Secondary | ICD-10-CM

## 2021-06-09 ENCOUNTER — Other Ambulatory Visit (HOSPITAL_BASED_OUTPATIENT_CLINIC_OR_DEPARTMENT_OTHER): Payer: Self-pay

## 2021-06-09 ENCOUNTER — Other Ambulatory Visit: Payer: Self-pay

## 2021-06-09 ENCOUNTER — Ambulatory Visit: Payer: BC Managed Care – PPO | Attending: Internal Medicine

## 2021-06-09 DIAGNOSIS — Z23 Encounter for immunization: Secondary | ICD-10-CM

## 2021-06-09 MED ORDER — PFIZER-BIONT COVID-19 VAC-TRIS 30 MCG/0.3ML IM SUSP
INTRAMUSCULAR | 0 refills | Status: DC
  Filled 2021-06-09: qty 0.3, 1d supply, fill #0

## 2021-06-09 NOTE — Progress Notes (Signed)
   Covid-19 Vaccination Clinic  Name:  Mary Richard    MRN: [id number] DOB: 06-02-1971  06/09/2021  [name] was observed post Covid-19 immunization for 15 minutes without incident. She was provided with Vaccine Information Sheet and instruction to access the V-Safe system.   [name] was instructed to call 911 with any severe reactions post vaccine: Difficulty breathing  Swelling of face and throat  A fast heartbeat  A bad rash all over body  Dizziness and weakness   Immunizations Administered     Name Date Dose VIS Date Route   PFIZER Comrnaty(Gray TOP) Covid-19 Vaccine 06/09/2021  3:25 PM 0.3 mL 10/14/2020 Intramuscular   Manufacturer: [place]   Lot: [id number]   [place]: [phone]

## 2021-06-27 ENCOUNTER — Telehealth: Payer: Self-pay | Admitting: *Deleted

## 2021-06-27 NOTE — Telephone Encounter (Signed)
Mary Richard ([id number]) Nurtec '75MG'$  dispersible tablets    Message from Plan Your PA has been resolved, no additional PA is required. For further inquiries please contact the number on the back of the member prescription card. (Message [phone])  Attempted to do PA and this is the message I received

## 2021-07-01 ENCOUNTER — Other Ambulatory Visit: Payer: Self-pay | Admitting: Adult Health

## 2021-07-01 DIAGNOSIS — G43009 Migraine without aura, not intractable, without status migrainosus: Secondary | ICD-10-CM

## 2021-07-05 NOTE — Telephone Encounter (Signed)
Please call pharmacy and make sure they are documenting on drug registry

## 2021-07-05 NOTE — Telephone Encounter (Signed)
I spoke with Mary Richard and was told this particular medication is not considered controlled and will not show up on the registry.  They also stated that medications are automatically added to the registry when they are filled.  Some of the similar medications such as Fiorinal may show up on the registry but not this one.

## 2021-07-19 ENCOUNTER — Other Ambulatory Visit: Payer: Self-pay | Admitting: Neurology

## 2021-07-19 DIAGNOSIS — G43009 Migraine without aura, not intractable, without status migrainosus: Secondary | ICD-10-CM

## 2021-08-03 ENCOUNTER — Other Ambulatory Visit: Payer: Self-pay | Admitting: Adult Health

## 2021-08-03 DIAGNOSIS — G43009 Migraine without aura, not intractable, without status migrainosus: Secondary | ICD-10-CM

## 2021-11-03 IMAGING — CR DG KNEE COMPLETE 4+V*R*
4 series · 4 of 4 positions shown · non-contrast
Comparison: None.

CLINICAL DATA: Right knee pain for 1 month, no known injury,
initial encounter

EXAM:
RIGHT KNEE - COMPLETE 4+ VIEW

[t knee ap right]
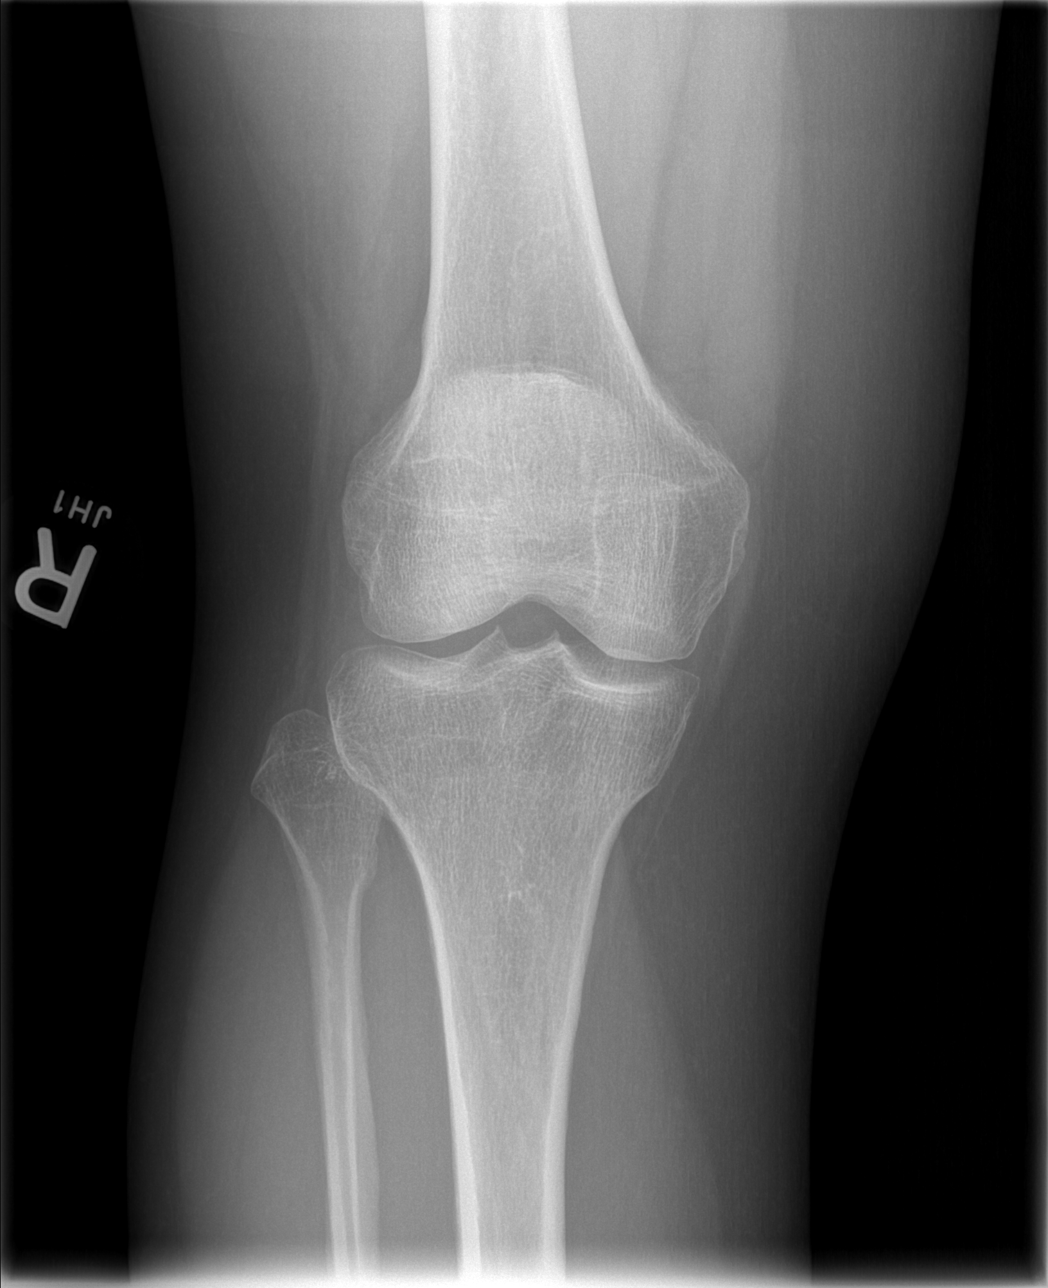

[t knee oblique right (1 of 2)]
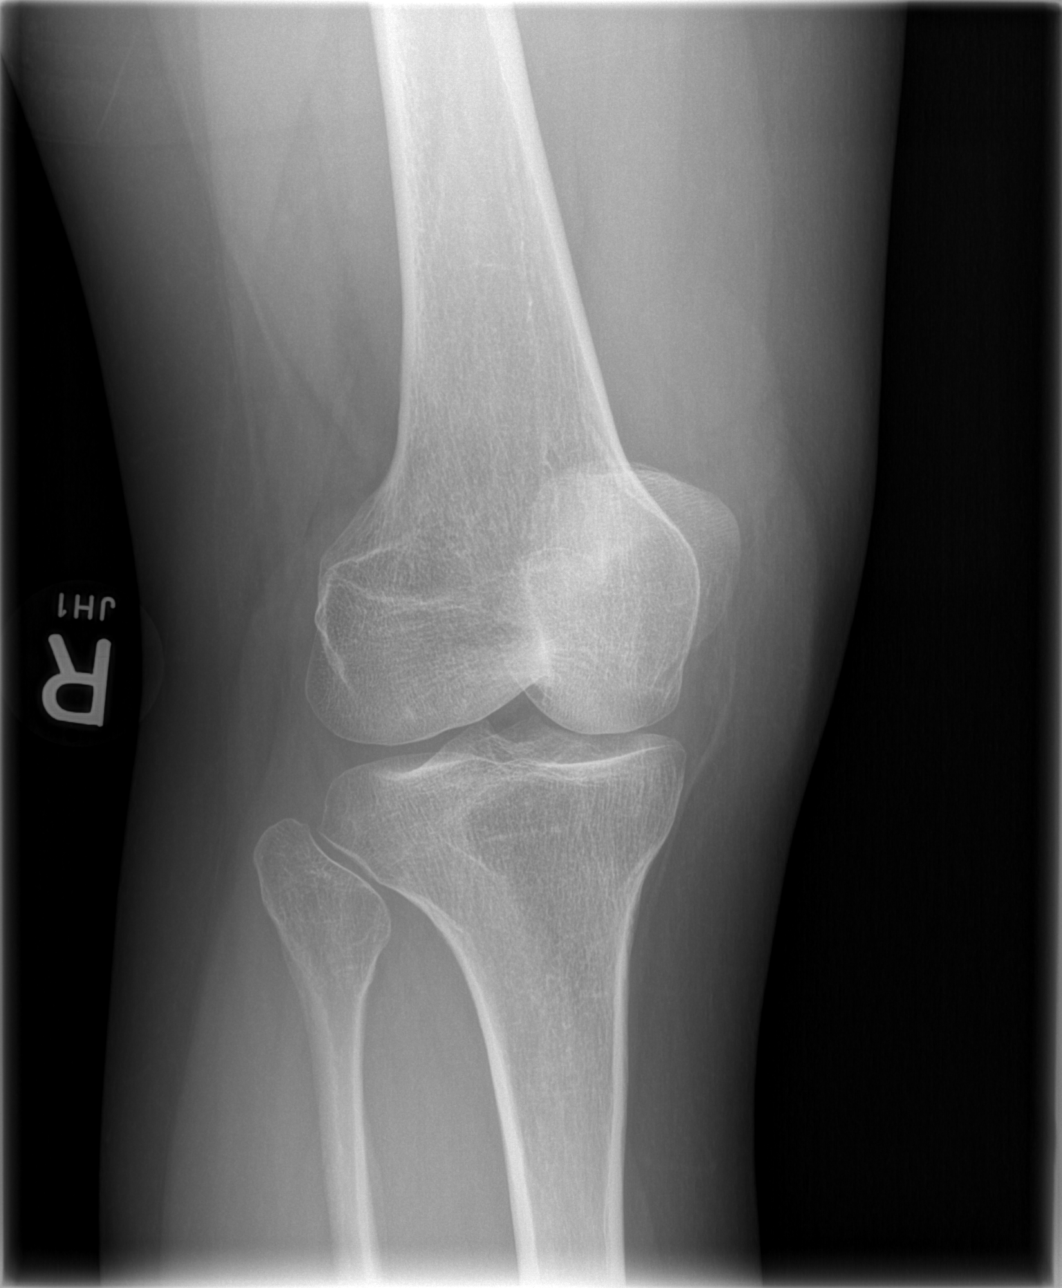

[t knee oblique right (2 of 2)]
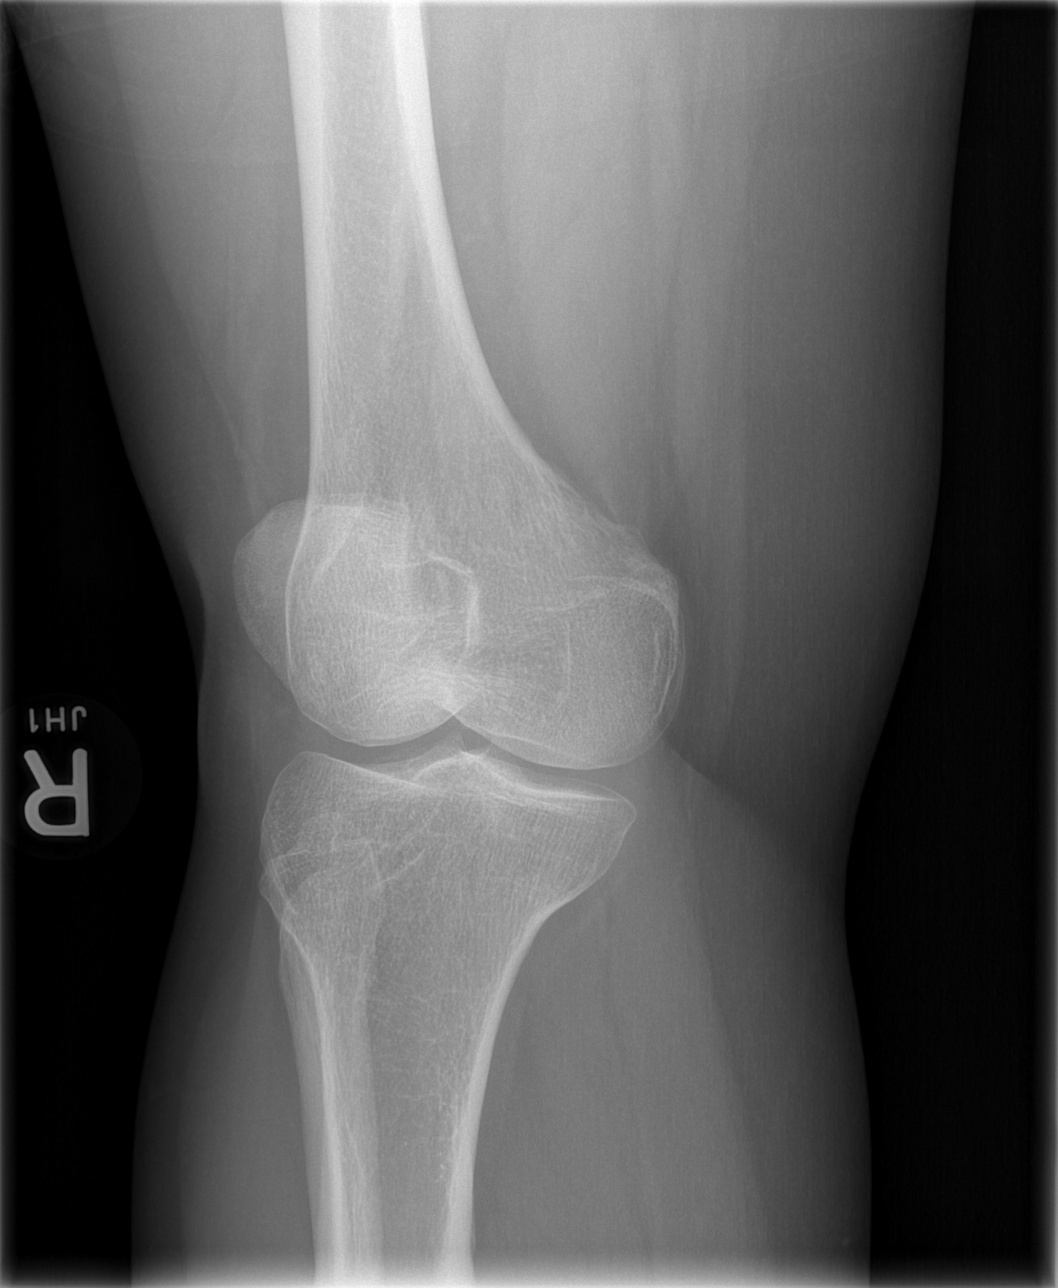

[t knee lat right]
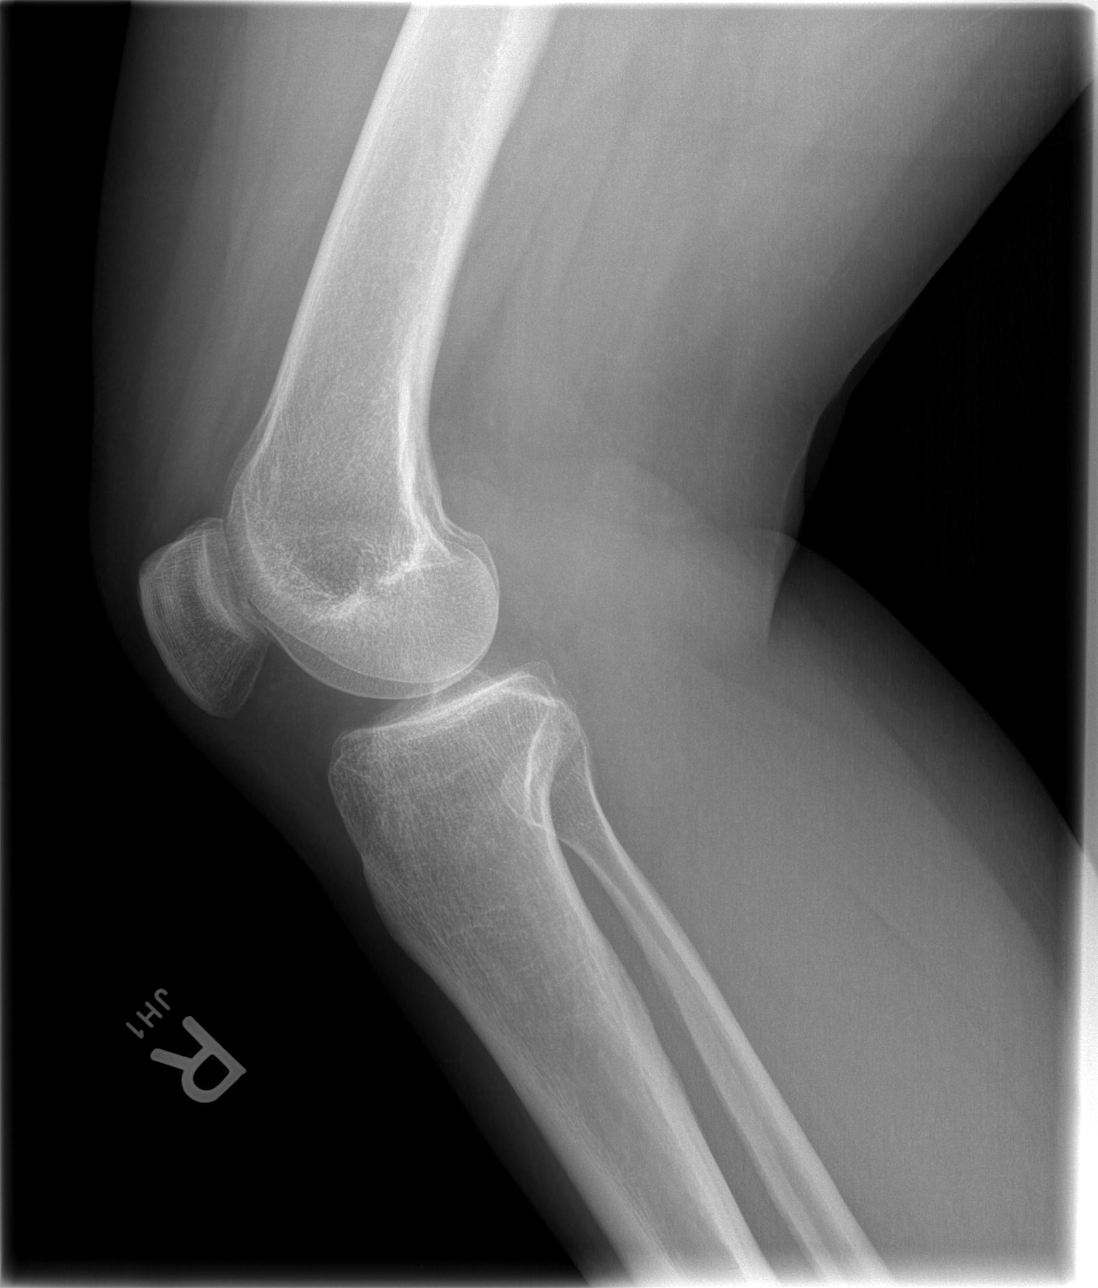

[4 of 4 positions shown; findings below may reference images not displayed]

FINDINGS: No evidence of fracture, dislocation, or joint effusion. No evidence
of arthropathy or other focal bone abnormality. Soft tissues are
unremarkable.
IMPRESSION: No acute abnormality noted.

## 2021-11-10 ENCOUNTER — Ambulatory Visit: Payer: BC Managed Care – PPO | Admitting: Adult Health

## 2021-11-10 ENCOUNTER — Encounter: Payer: Self-pay | Admitting: Adult Health

## 2021-11-10 DIAGNOSIS — G43009 Migraine without aura, not intractable, without status migrainosus: Secondary | ICD-10-CM | POA: Diagnosis not present

## 2021-11-10 MED ORDER — BUTALBITAL-APAP-CAFFEINE 50-325-40 MG PO TABS: 1.0000 | ORAL_TABLET | Freq: Four times a day (QID) | ORAL | 5 refills | Status: DC | PRN

## 2021-11-10 MED ORDER — GABAPENTIN 300 MG PO CAPS: 300.0000 mg | ORAL_CAPSULE | Freq: Every day | ORAL | 3 refills | Status: DC

## 2021-11-10 MED ORDER — TOPIRAMATE 100 MG PO TABS: ORAL_TABLET | ORAL | 3 refills | Status: DC

## 2021-11-10 NOTE — Progress Notes (Signed)
PATIENT: Mary Richard DOB: [birth year]  REASON FOR VISIT: follow up HISTORY FROM: patient  HISTORY OF PRESENT ILLNESS: Today 11/10/21:  Mary Richard is a 51 year old female with a history of migraine headaches.  She returns today for follow-up.  She reports that she only has headaches around her menstrual cycle.  Typically she can take Fioricet in the headache resolves fairly quickly.  She remains on Topamax 100 mg in the morning and 200 mg at bedtime.  She does note word finding issues on Topamax.  She remains on gabapentin 300 mg at bedtime  11/10/20: Mary Richard is a 51 year old female with a history of migraine headaches.  She returns today for follow-up.  She reports that her headaches are under good control.  She only has headaches during her menstrual cycle.  She may have 2-4 headaches during this time.  She typically can take Fioricet and her headache resolves fairly quickly.  She continues to take gabapentin 300 mg at bedtime and Topamax 100 mg in the morning and 200 mg in the evening.  In the past she has tried [place] with little benefit.  She returns today for an evaluation.  10/22/21[name] is a 51 y.o. female who has been followed in this office for/ migraine headache.  She reports that she had to stop Ajovy and Nurtec because of hair loss and muscle aches.  She is now only taking Topamax and gabapentin.  She states that the majority of the month she does well with her migraines.  She states that during her menstrual cycle she will get 2-3 headaches.  Sometimes the headaches may last 1 to 2 days.  In the past she has been on Fioricet and it works well to resolve her headaches however she was taking up to 20 tablets a day.  In the past she has been on sumatriptan and reports that she tolerated well however it quit working for her headaches.  She returns today for an evaluation.    HISTORY She reports having about 5 or 6 migraines per month.  She does have quite a bit of stress due to  elderly in-laws.  She tries to hydrate well with water.  She got her first Covid vaccine dose recently and is due for the second dose on 02/11/2020.  She would be interested in trying one of the newer medications.  We talked about this before.  She continues to take Fioricet as needed and while she has not overused it, she does require a refill on this on a fairly regular basis.  She continues to take fairly high dose of Topamax and we talked about potentially switching her to one of the newer injectables.  Her eye exam is up-to-date as of January 2021.  REVIEW OF SYSTEMS: Out of a complete 14 system review of symptoms, the patient complains only of the following symptoms, and all other reviewed systems are negative.  See HPI  ALLERGIES: Allergies  Allergen Reactions   Flagyl [Metronidazole]    Ibuprofen    Neosporin [Neomycin-Bacitracin Zn-Polymyx]     HOME MEDICATIONS: Outpatient Medications Prior to Visit  Medication Sig Dispense Refill   butalbital-acetaminophen-caffeine (FIORICET) 50-325-40 MG tablet TAKE 1 TABLET BY MOUTH EVERY 6 HOURS AS NEEDED FOR HEADACHE 8 tablet 5   Cholecalciferol (VITAMIN D) 2000 UNITS CAPS Take 2,000 capsules by mouth daily.     COVID-19 mRNA Vac-TriS, Pfizer, (PFIZER-BIONT COVID-19 VAC-TRIS) SUSP injection Inject into the muscle. 0.3 mL 0   diltiazem (CARDIZEM  SR) 90 MG 12 hr capsule 90 mg every other day. Per patient, every other day.     Fish Oil OIL Take 3,000 mg by mouth daily.     fluticasone (FLONASE) 50 MCG/ACT nasal spray 50 sprays as directed.     gabapentin (NEURONTIN) 300 MG capsule Take 1 capsule (300 mg total) by mouth at bedtime. 90 capsule 3   Loratadine (CLARITIN PO) Take by mouth daily.     Multiple Vitamin (MULTIVITAMIN) tablet Take 1 tablet by mouth daily.     topiramate (TOPAMAX) 100 MG tablet TAKE 1 TABLET BY MOUTH EVERY MORNING AND 2 TABLETS AT BEDTIME 270 tablet 3   vitamin E 400 UNIT capsule Take 400 Units by mouth daily.     No  facility-administered medications prior to visit.    PAST MEDICAL HISTORY: Past Medical History:  Diagnosis Date   Cancer ([place])    Skin   Chronic kidney disease    Headache(784.0)    Hypertension    Migraine without aura 07/17/2013   Raynaud disease    Rosacea     PAST SURGICAL HISTORY: Past Surgical History:  Procedure Laterality Date   SKIN SURGERY      FAMILY HISTORY: Family History  Problem Relation Age of Onset   Cancer Father    Macular degeneration Father    Macular degeneration Paternal Grandfather     SOCIAL HISTORY: Social History   Socioeconomic History   Marital status: Married    Spouse name: Not on file   Number of children: 2   Years of education: college   Highest education level: Not on file  Occupational History   Occupation: [employer]  Tobacco Use   Smoking status: Never   Smokeless tobacco: Never  Substance and Sexual Activity   Alcohol use: No    Comment: Patient drinks one coke a day   Drug use: No   Sexual activity: Not on file  Other Topics Concern   Not on file  Social History Narrative   Not on file   Social Determinants of Health   Financial Resource Strain: Not on file  Food Insecurity: Not on file  Transportation Needs: Not on file  Physical Activity: Not on file  Stress: Not on file  Social Connections: Not on file  Intimate Partner Violence: Not on file      PHYSICAL EXAM  Vitals:   11/10/21 1350  BP: 124/76  Pulse: 72  Weight: 194 lb (88 kg)  Height: 5\' 3"  (1.6 m)    Body mass index is 34.37 kg/m.  Generalized: Well developed, in no acute distress   Neurological examination  Mentation: Alert oriented to time, place, history taking. Follows all commands speech and language fluent Cranial nerve II-XII: Pupils were equal round reactive to light. Extraocular movements were full, visual field were full on confrontational test. . Head turning and shoulder shrug  were normal and symmetric. Motor: The motor testing  reveals 5 over 5 strength of all 4 extremities. Good symmetric motor tone is noted throughout.  Sensory: Sensory testing is intact to soft touch on all 4 extremities. No evidence of extinction is noted.  Coordination: Cerebellar testing reveals good finger-nose-finger and heel-to-shin bilaterally.  Gait and station: Gait is normal. Reflexes: Deep tendon reflexes are symmetric and normal bilaterally.   DIAGNOSTIC DATA (LABS, IMAGING, TESTING) - I reviewed patient records, labs, notes, testing and imaging myself where available.     ASSESSMENT AND PLAN 51 y.o. year old female  has a  past medical history of Cancer [facility]), Chronic kidney disease, Headache(784.0), Hypertension, Migraine without aura (07/17/2013), Raynaud disease, and Rosacea. here with  Migraine headaches   Continue Topamax 100 mg in the morning 200 mg in the evening Continue gabapentin 300 mg at bedtime Take Fioricet at the onset of a migraine. Advised that if she wanted to reduce her dose of Topamax she could do so by reducing her dose by 50 mg each week. Follow-up in 1 year or sooner if needed    Ward Givens, MSN, NP-C 11/10/2021, 1:47 PM [facility] [address], [place], [place] [address] [phone]

## 2021-11-10 NOTE — Patient Instructions (Signed)
Continue Topamax 100 mg in the morning 200 mg in the evening Continue gabapentin 300 mg at bedtime Take Fioricet at the onset of a migraine.

## 2022-03-30 IMAGING — CR DG CHEST 2V
2 series · 2 of 2 positions shown · non-contrast
Comparison: None.

CLINICAL DATA: Dry cough for 2 weeks

EXAM:
CHEST - 2 VIEW

[w chest pa *]
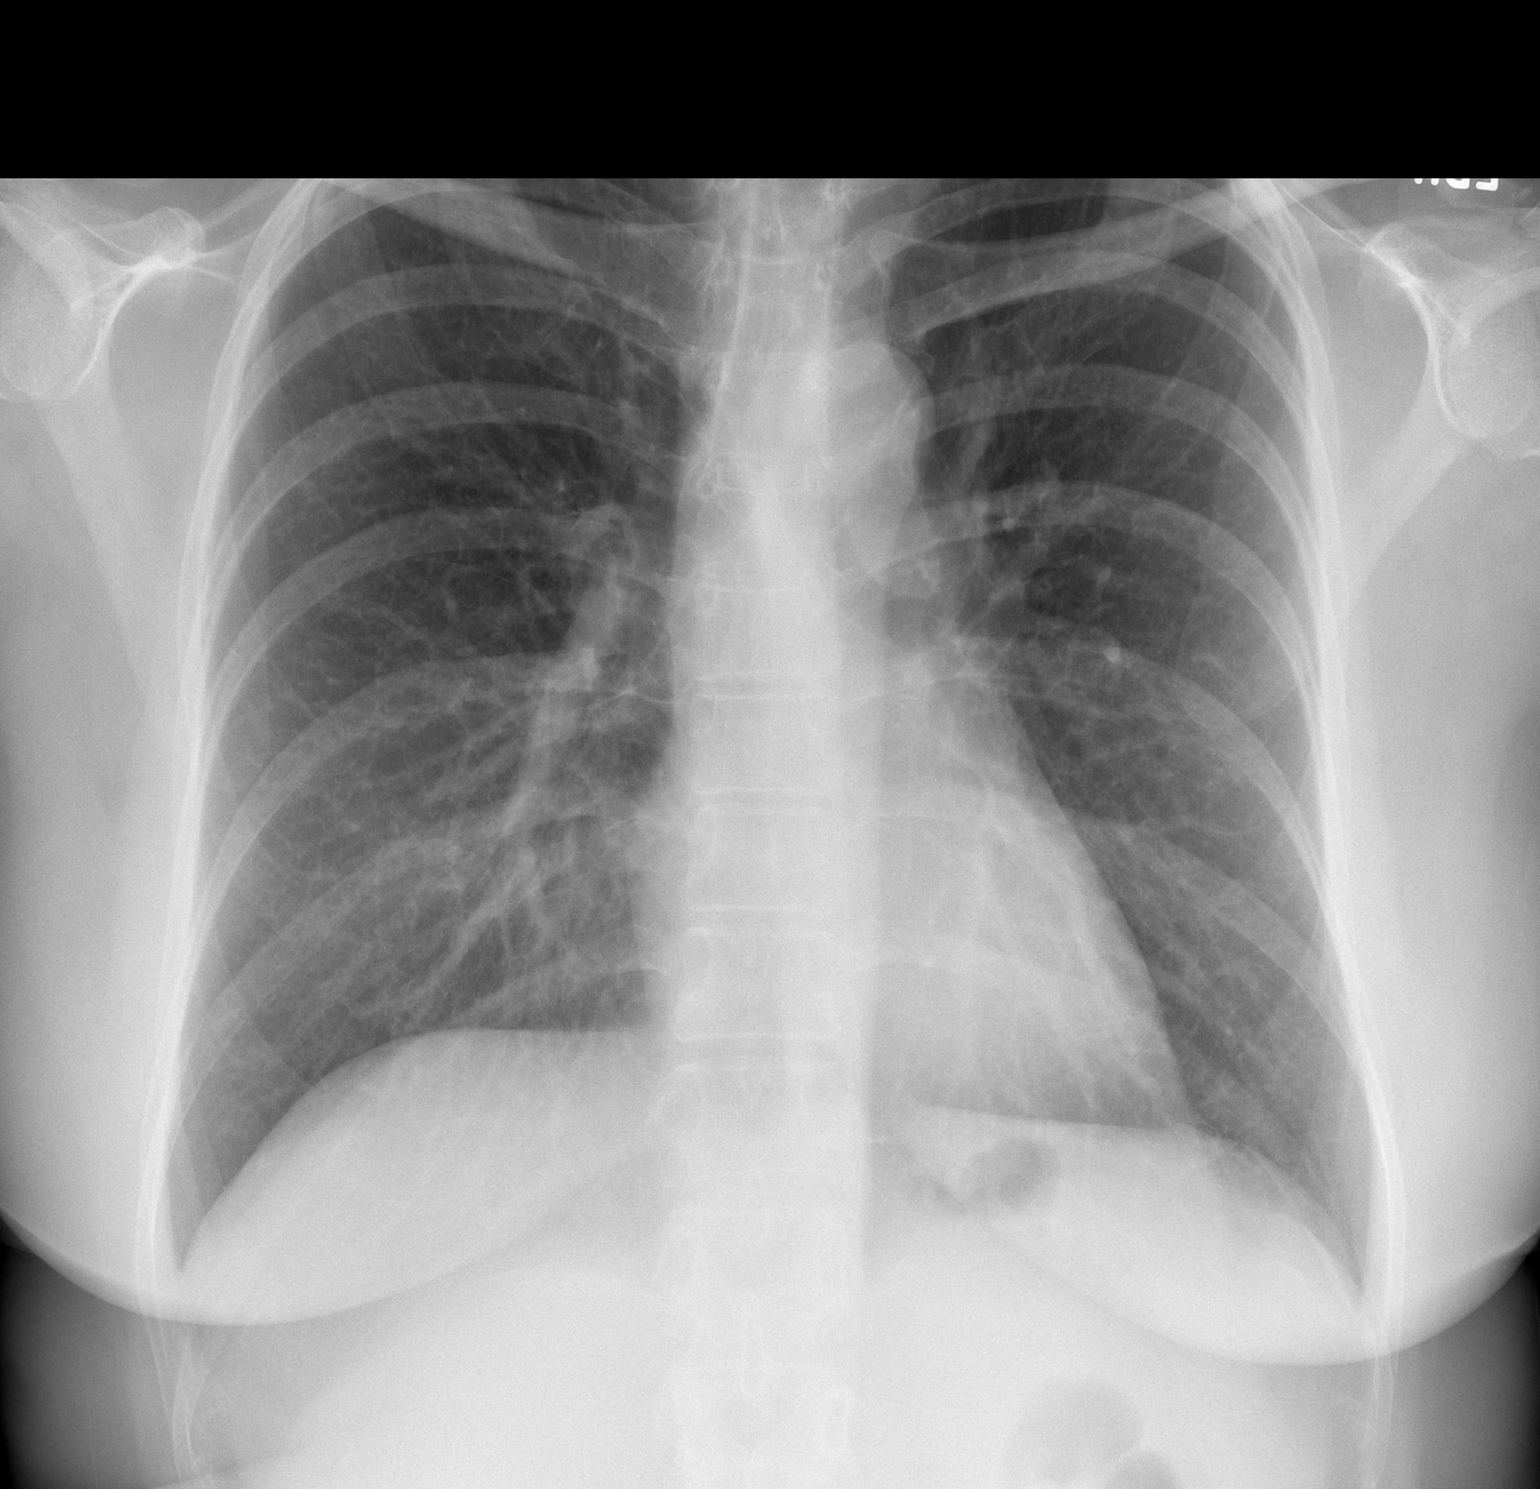

[w chest lat]
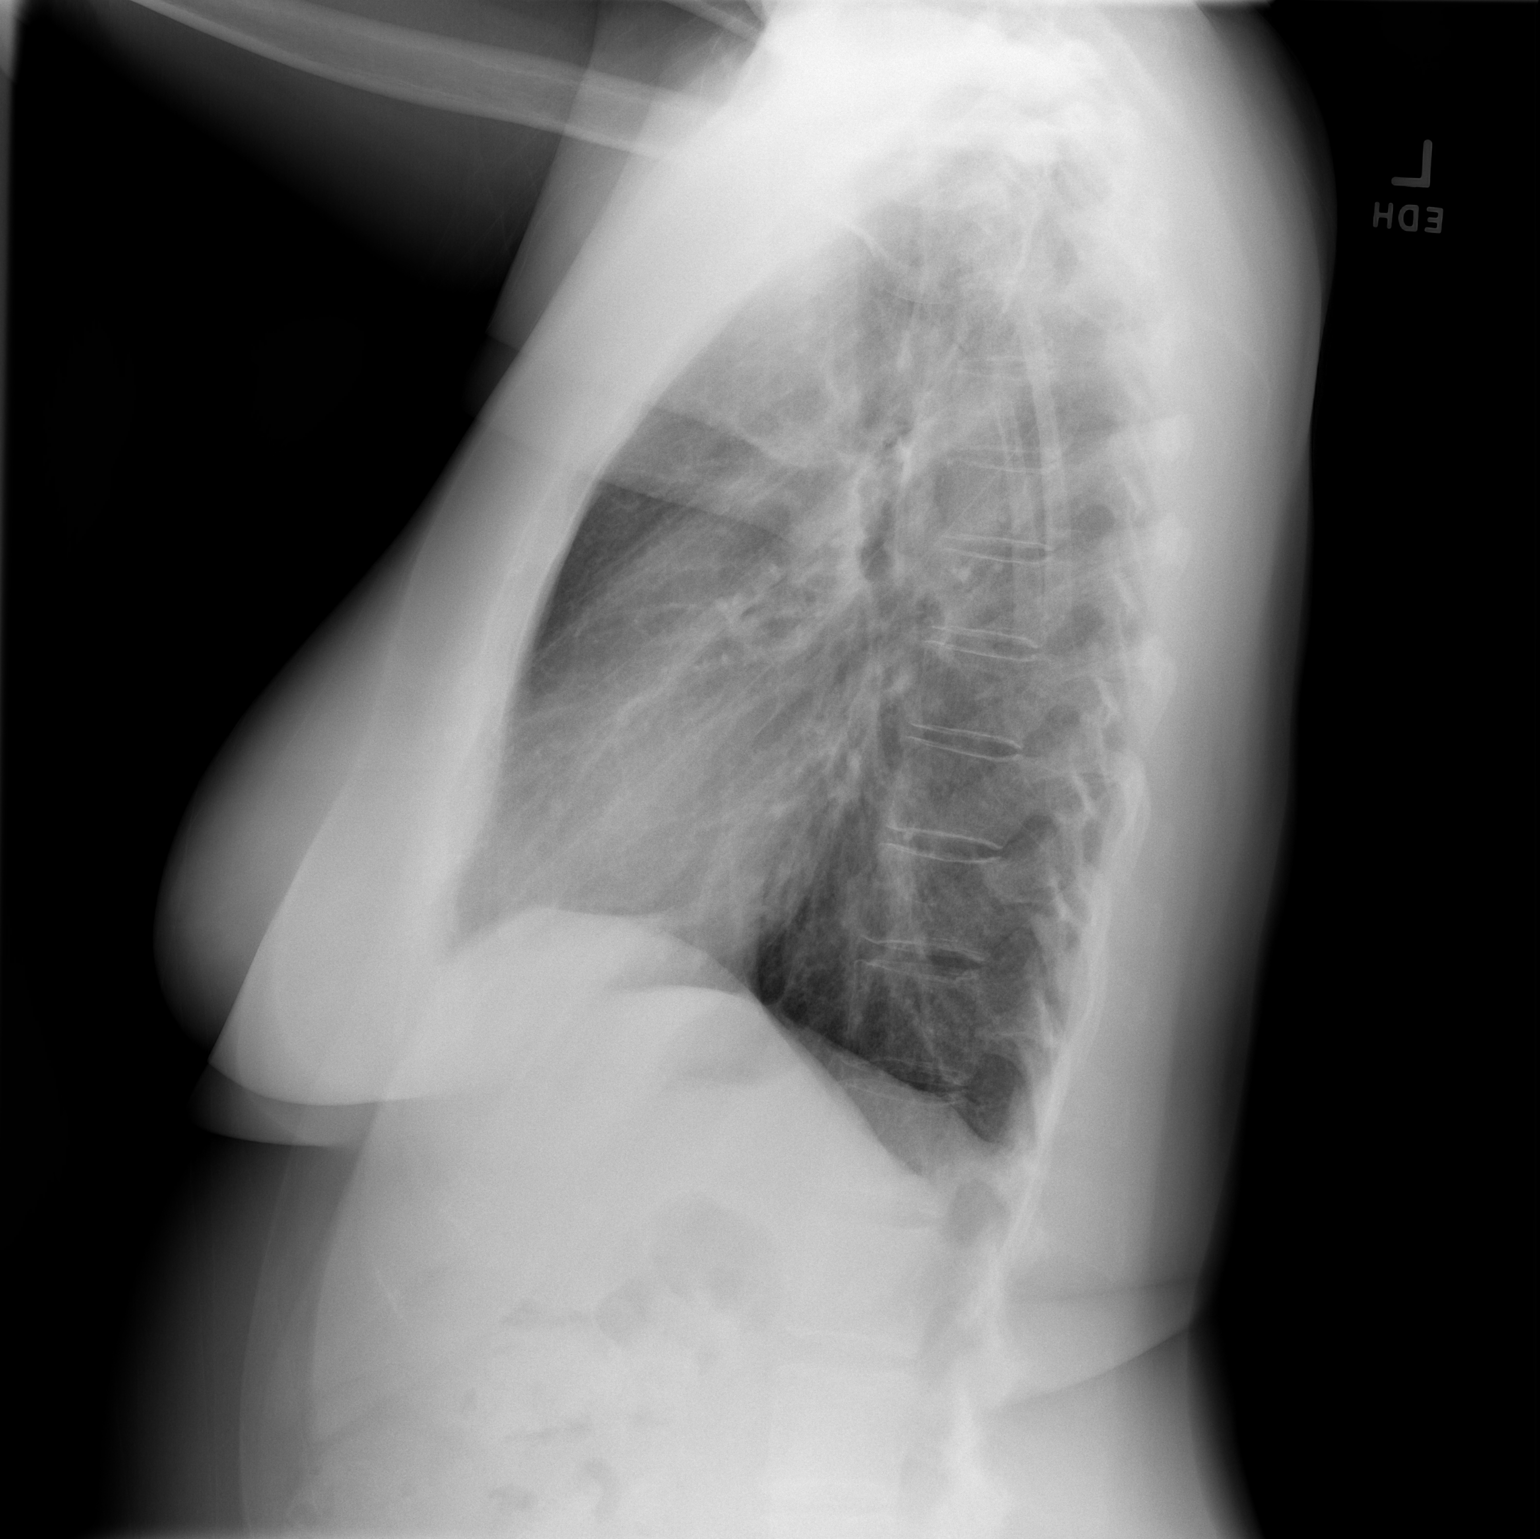

[2 of 2 positions shown; findings below may reference images not displayed]

FINDINGS: The heart size and mediastinal contours are within normal limits.
Both lungs are clear. The visualized skeletal structures are
unremarkable.
IMPRESSION: No active cardiopulmonary disease.

## 2022-05-03 ENCOUNTER — Other Ambulatory Visit: Payer: Self-pay | Admitting: Adult Health

## 2022-05-03 DIAGNOSIS — G43009 Migraine without aura, not intractable, without status migrainosus: Secondary | ICD-10-CM

## 2022-07-08 ENCOUNTER — Other Ambulatory Visit: Payer: Self-pay | Admitting: *Deleted

## 2022-07-08 DIAGNOSIS — G43009 Migraine without aura, not intractable, without status migrainosus: Secondary | ICD-10-CM

## 2022-07-08 MED ORDER — TOPIRAMATE 100 MG PO TABS: ORAL_TABLET | ORAL | 0 refills | Status: DC

## 2022-10-18 ENCOUNTER — Other Ambulatory Visit: Payer: Self-pay | Admitting: Adult Health

## 2022-10-18 DIAGNOSIS — G43009 Migraine without aura, not intractable, without status migrainosus: Secondary | ICD-10-CM

## 2022-10-31 ENCOUNTER — Other Ambulatory Visit: Payer: Self-pay

## 2022-10-31 ENCOUNTER — Other Ambulatory Visit: Payer: Self-pay | Admitting: Adult Health

## 2022-10-31 NOTE — Telephone Encounter (Signed)
Rx sent 

## 2022-11-08 ENCOUNTER — Other Ambulatory Visit: Payer: Self-pay | Admitting: Adult Health

## 2022-11-08 DIAGNOSIS — G43009 Migraine without aura, not intractable, without status migrainosus: Secondary | ICD-10-CM

## 2022-11-13 NOTE — Telephone Encounter (Signed)
Medication does not show on the [facility] drug registry. Rx has been signed and faxed to pharmacy. Received a receipt of confirmation.

## 2022-11-16 ENCOUNTER — Other Ambulatory Visit: Payer: Self-pay | Admitting: Adult Health

## 2022-11-16 ENCOUNTER — Encounter: Payer: Self-pay | Admitting: Adult Health

## 2022-11-16 ENCOUNTER — Ambulatory Visit: Payer: BC Managed Care – PPO | Admitting: Adult Health

## 2022-11-16 DIAGNOSIS — G43009 Migraine without aura, not intractable, without status migrainosus: Secondary | ICD-10-CM

## 2022-11-16 MED ORDER — GABAPENTIN 300 MG PO CAPS: ORAL_CAPSULE | ORAL | 3 refills | Status: DC

## 2022-11-16 MED ORDER — TOPIRAMATE 100 MG PO TABS: ORAL_TABLET | ORAL | 3 refills | Status: AC

## 2022-11-16 NOTE — Progress Notes (Signed)
PATIENT: Mary Richard DOB: 1970/12/04  REASON FOR VISIT: follow up HISTORY FROM: patient  Chief Complaint  Patient presents with   Follow-up    Rm 4, alone.  Migraines.  Doing well, other then 2 around menstrual cycle.      HISTORY OF PRESENT ILLNESS: Today 11/16/22:  Mary Richard is a 52 y.o. female with a history of migraine headaches. Returns today for follow-up.  Overall she feels that she is doing well.  She remains on Topamax 100 mg in the a.m. and 200 mg at bedtime.  Tried Reducing Topamax but the headaches increased. Continues on gabapentin 300 mg at bedtime.  Uses Fioricet as an abortive therapy.  States that she only gets approximately 2 headaches around her menstrual cycle.  She states that she is in perimenopause so she may have 2 cycles a month.  She does see an OB/GYN   11/10/21 Mary Richard is a 52 year old female with a history of migraine headaches.  She returns today for follow-up.  She reports that she only has headaches around her menstrual cycle.  Typically she can take Fioricet in the headache resolves fairly quickly.  She remains on Topamax 100 mg in the morning and 200 mg at bedtime.  She does note word finding issues on Topamax.  She remains on gabapentin 300 mg at bedtime  11/10/20: Mary Richard is a 52 year old female with a history of migraine headaches.  She returns today for follow-up.  She reports that her headaches are under good control.  She only has headaches during her menstrual cycle.  She may have 2-4 headaches during this time.  She typically can take Fioricet and her headache resolves fairly quickly.  She continues to take gabapentin 300 mg at bedtime and Topamax 100 mg in the morning and 200 mg in the evening.  In the past she has tried [place] with little benefit.  She returns today for an evaluation.  10/22/21[name] is a 53 y.o. female who has been followed in this office for/ migraine headache.  She reports that she had to stop Ajovy and Nurtec  because of hair loss and muscle aches.  She is now only taking Topamax and gabapentin.  She states that the majority of the month she does well with her migraines.  She states that during her menstrual cycle she will get 2-3 headaches.  Sometimes the headaches may last 1 to 2 days.  In the past she has been on Fioricet and it works well to resolve her headaches however she was taking up to 20 tablets a day.  In the past she has been on sumatriptan and reports that she tolerated well however it quit working for her headaches.  She returns today for an evaluation.    HISTORY She reports having about 5 or 6 migraines per month.  She does have quite a bit of stress due to elderly in-laws.  She tries to hydrate well with water.  She got her first Covid vaccine dose recently and is due for the second dose on 02/11/2020.  She would be interested in trying one of the newer medications.  We talked about this before.  She continues to take Fioricet as needed and while she has not overused it, she does require a refill on this on a fairly regular basis.  She continues to take fairly high dose of Topamax and we talked about potentially switching her to one of the newer injectables.  Her eye exam  is up-to-date as of January 2021.  REVIEW OF SYSTEMS: Out of a complete 14 system review of symptoms, the patient complains only of the following symptoms, and all other reviewed systems are negative.  See HPI  ALLERGIES: Allergies  Allergen Reactions   Flagyl [Metronidazole]    Ibuprofen    Neosporin [Neomycin-Bacitracin Zn-Polymyx]     HOME MEDICATIONS: Outpatient Medications Prior to Visit  Medication Sig Dispense Refill   butalbital-acetaminophen-caffeine (FIORICET) 50-325-40 MG tablet TAKE 1 TABLET BY MOUTH EVERY 6 HOURS AS NEEDED FOR HEADACHE 8 tablet 0   Cholecalciferol (VITAMIN D) 2000 UNITS CAPS Take 2,000 capsules by mouth daily.     diltiazem (CARDIZEM SR) 90 MG 12 hr capsule 90 mg every other day. Per  patient, every other day.     Fish Oil OIL Take 3,000 mg by mouth daily.     fluticasone (FLONASE) 50 MCG/ACT nasal spray 50 sprays as directed.     gabapentin (NEURONTIN) 300 MG capsule TAKE 1 CAPSULE(300 MG) BY MOUTH AT BEDTIME 90 capsule 0   Loratadine (CLARITIN PO) Take by mouth daily.     Multiple Vitamin (MULTIVITAMIN) tablet Take 1 tablet by mouth daily.     topiramate (TOPAMAX) 100 MG tablet TAKE 1 TABLET BY MOUTH EVERY MORNING AND 2 TABLTS AT BEDTIME 270 tablet 0   vitamin E 400 UNIT capsule Take 400 Units by mouth daily.     COVID-19 mRNA Vac-TriS, Pfizer, (PFIZER-BIONT COVID-19 VAC-TRIS) SUSP injection Inject into the muscle. 0.3 mL 0   No facility-administered medications prior to visit.    PAST MEDICAL HISTORY: Past Medical History:  Diagnosis Date   Cancer ([place])    Skin   Chronic kidney disease    Headache(784.0)    Hypertension    Migraine without aura 07/17/2013   Raynaud disease    Rosacea     PAST SURGICAL HISTORY: Past Surgical History:  Procedure Laterality Date   SKIN SURGERY      FAMILY HISTORY: Family History  Problem Relation Age of Onset   Dementia Mother    Cancer Father    Macular degeneration Father    Macular degeneration Paternal Grandfather     SOCIAL HISTORY: Social History   Socioeconomic History   Marital status: Married    Spouse name: Not on file   Number of children: 2   Years of education: college   Highest education level: Not on file  Occupational History   Occupation: [employer]  Tobacco Use   Smoking status: Never   Smokeless tobacco: Never  Vaping Use   Vaping Use: Never used  Substance and Sexual Activity   Alcohol use: No   Drug use: No   Sexual activity: Not on file  Other Topics Concern   Not on file  Social History Narrative   Lives at home with spouse and 1 child   Right handed   Caffeine: 1 coke/day   Social Determinants of Health   Financial Resource Strain: Not on file  Food Insecurity: Not on file   Transportation Needs: Not on file  Physical Activity: Not on file  Stress: Not on file  Social Connections: Not on file  Intimate Partner Violence: Not on file      PHYSICAL EXAM  Vitals:   11/16/22 1337  BP: 136/77  Pulse: 69  Weight: 192 lb 9.6 oz (87.4 kg)  Height: '5\' 3"'$  (1.6 m)    Body mass index is 34.12 kg/m.  Generalized: Well developed, in no acute distress  Neurological examination  Mentation: Alert oriented to time, place, history taking. Follows all commands speech and language fluent Cranial nerve II-XII: Pupils were equal round reactive to light. Extraocular movements were full, visual field were full on confrontational test. . Head turning and shoulder shrug  were normal and symmetric. Motor: The motor testing reveals 5 over 5 strength of all 4 extremities. Good symmetric motor tone is noted throughout.  Sensory: Sensory testing is intact to soft touch on all 4 extremities. No evidence of extinction is noted.  Coordination: Cerebellar testing reveals good finger-nose-finger and heel-to-shin bilaterally.  Gait and station: Gait is normal.   DIAGNOSTIC DATA (LABS, IMAGING, TESTING) - I reviewed patient records, labs, notes, testing and imaging myself where available.     ASSESSMENT AND PLAN 52 y.o. year old female  has a past medical history of Cancer ([place]), Chronic kidney disease, Headache(784.0), Hypertension, Migraine without aura (07/17/2013), Raynaud disease, and Rosacea. here with  Migraine headaches   Continue Topamax 100 mg in the morning 200 mg in the evening Continue gabapentin 300 mg at bedtime Take Fioricet at the onset of a migraine. Advised that if she wanted to reduce her dose of Topamax she could do so by reducing her dose by 50 mg each week. Follow-up in 1 year or sooner if needed    Ward Givens, MSN, NP-C 11/16/2022, 1:49 PM [facility] [address] [place], [place] [address] [phone]

## 2022-11-21 MED ORDER — BUTALBITAL-APAP-CAFFEINE 50-325-40 MG PO TABS: 1.0000 | ORAL_TABLET | Freq: Four times a day (QID) | ORAL | 3 refills | Status: DC | PRN

## 2023-03-23 ENCOUNTER — Other Ambulatory Visit: Payer: Self-pay | Admitting: Adult Health

## 2023-03-23 DIAGNOSIS — G43009 Migraine without aura, not intractable, without status migrainosus: Secondary | ICD-10-CM

## 2023-03-28 ENCOUNTER — Other Ambulatory Visit: Payer: Self-pay | Admitting: *Deleted

## 2023-03-28 DIAGNOSIS — G43009 Migraine without aura, not intractable, without status migrainosus: Secondary | ICD-10-CM

## 2023-03-28 MED ORDER — BUTALBITAL-APAP-CAFFEINE 50-325-40 MG PO TABS: 1.0000 | ORAL_TABLET | Freq: Four times a day (QID) | ORAL | 3 refills | Status: DC | PRN

## 2023-10-09 ENCOUNTER — Other Ambulatory Visit: Payer: Self-pay | Admitting: Adult Health

## 2023-10-09 DIAGNOSIS — G43009 Migraine without aura, not intractable, without status migrainosus: Secondary | ICD-10-CM

## 2023-10-09 MED ORDER — BUTALBITAL-APAP-CAFFEINE 50-325-40 MG PO TABS: ORAL_TABLET | ORAL | 3 refills | Status: AC

## 2023-10-09 NOTE — Telephone Encounter (Signed)
Pt is requesting a refill for butalbital-acetaminophen-caffeine (FIORICET) 50-325-40 MG tablet.  Pharmacy: [facility] [phone]

## 2023-10-09 NOTE — Telephone Encounter (Signed)
Last visit 11/16/22  Next visit 05/05/24  Per Epic adherence report, last filed:    Rx refills sent to [facility]

## 2023-11-22 ENCOUNTER — Ambulatory Visit: Payer: BC Managed Care – PPO | Admitting: Adult Health

## 2024-01-14 ENCOUNTER — Other Ambulatory Visit: Payer: Self-pay | Admitting: Adult Health

## 2024-04-28 ENCOUNTER — Telehealth: Payer: Self-pay | Admitting: Adult Health

## 2024-04-28 NOTE — Telephone Encounter (Signed)
 Request to cx appointment, conflict with insurance

## 2024-05-05 ENCOUNTER — Ambulatory Visit: Payer: BC Managed Care – PPO | Admitting: Adult Health

## 2024-07-30 ENCOUNTER — Other Ambulatory Visit (HOSPITAL_BASED_OUTPATIENT_CLINIC_OR_DEPARTMENT_OTHER): Payer: Self-pay

## 2024-07-30 MED ORDER — FLUZONE 0.5 ML IM SUSY
0.5000 mL | PREFILLED_SYRINGE | Freq: Once | INTRAMUSCULAR | 0 refills | Status: AC
  Filled 2024-07-30: qty 0.5, 1d supply, fill #0

## 2024-11-24 ENCOUNTER — Other Ambulatory Visit (HOSPITAL_BASED_OUTPATIENT_CLINIC_OR_DEPARTMENT_OTHER): Payer: Self-pay

## 2024-11-25 ENCOUNTER — Other Ambulatory Visit (HOSPITAL_BASED_OUTPATIENT_CLINIC_OR_DEPARTMENT_OTHER): Payer: Self-pay

## 2024-11-25 MED ORDER — NURTEC 75 MG PO TBDP
ORAL_TABLET | ORAL | 5 refills | Status: AC
  Filled 2024-11-25: qty 8, 8d supply, fill #0

## 2024-11-25 MED ORDER — VALACYCLOVIR HCL 1 G PO TABS
2.0000 g | ORAL_TABLET | Freq: Two times a day (BID) | ORAL | 3 refills | Status: AC
  Filled 2024-11-25: qty 30, 8d supply, fill #0

## 2024-11-25 MED ORDER — FLUTICASONE PROPIONATE 50 MCG/ACT NA SUSP
2.0000 | Freq: Every day | NASAL | 3 refills | Status: AC
  Filled 2024-11-25: qty 48, 90d supply, fill #0

## 2024-11-25 MED ORDER — ZONISAMIDE 100 MG PO CAPS
100.0000 mg | ORAL_CAPSULE | Freq: Every day | ORAL | 5 refills | Status: AC
  Filled 2024-11-25: qty 30, 30d supply, fill #0

## 2024-11-25 MED ORDER — DILTIAZEM HCL ER 90 MG PO CP12
90.0000 mg | ORAL_CAPSULE | ORAL | 5 refills | Status: AC
  Filled 2024-11-25: qty 45, 90d supply, fill #0

## 2024-11-26 ENCOUNTER — Other Ambulatory Visit (HOSPITAL_BASED_OUTPATIENT_CLINIC_OR_DEPARTMENT_OTHER): Payer: Self-pay

## 2024-11-26 ENCOUNTER — Other Ambulatory Visit (HOSPITAL_COMMUNITY): Payer: Self-pay

## 2024-11-26 MED ORDER — ESTRADIOL 0.01 % VA CREA
TOPICAL_CREAM | VAGINAL | 1 refills | Status: AC
  Filled 2024-11-26: qty 42.5, 90d supply, fill #0

## 2024-11-26 MED ORDER — NURTEC 75 MG PO TBDP
75.0000 mg | ORAL_TABLET | Freq: Every day | ORAL | 3 refills | Status: AC | PRN
  Filled 2024-11-26: qty 8, 8d supply, fill #0

## 2024-11-26 MED ORDER — FLUTICASONE PROPIONATE 50 MCG/ACT NA SUSP
2.0000 | Freq: Every day | NASAL | 3 refills | Status: AC
  Filled 2024-11-26 – 2024-11-29 (×2): qty 48, 90d supply, fill #0

## 2024-11-26 MED ORDER — KETOCONAZOLE 2 % EX CREA
TOPICAL_CREAM | Freq: Every day | CUTANEOUS | 1 refills | Status: AC
  Filled 2024-11-26: qty 15, 15d supply, fill #0

## 2024-11-26 MED ORDER — DILTIAZEM HCL ER 90 MG PO CP12
90.0000 mg | ORAL_CAPSULE | ORAL | 5 refills | Status: AC
  Filled 2024-11-26: qty 45, 90d supply, fill #0

## 2024-11-26 MED ORDER — METRONIDAZOLE 1 % EX GEL
1.0000 | Freq: Every day | CUTANEOUS | 5 refills | Status: AC
  Filled 2024-11-26: qty 60, 30d supply, fill #0

## 2024-11-27 ENCOUNTER — Other Ambulatory Visit: Payer: Self-pay

## 2024-11-27 ENCOUNTER — Other Ambulatory Visit (HOSPITAL_BASED_OUTPATIENT_CLINIC_OR_DEPARTMENT_OTHER): Payer: Self-pay

## 2024-11-29 ENCOUNTER — Other Ambulatory Visit (HOSPITAL_BASED_OUTPATIENT_CLINIC_OR_DEPARTMENT_OTHER): Payer: Self-pay
